# Patient Record
Sex: Male | Born: 1990 | Race: Black or African American | Hispanic: No | Marital: Single | Smoking: Never smoker
Health system: Southern US, Community
[De-identification: ages and names within clinical notes are randomized; demographics above are authoritative.]

## PROBLEM LIST (undated history)

## (undated) DIAGNOSIS — R569 Unspecified convulsions: Secondary | ICD-10-CM

## (undated) DIAGNOSIS — E739 Lactose intolerance, unspecified: Secondary | ICD-10-CM

## (undated) DIAGNOSIS — D649 Anemia, unspecified: Secondary | ICD-10-CM

## (undated) DIAGNOSIS — K5900 Constipation, unspecified: Secondary | ICD-10-CM

## (undated) DIAGNOSIS — S0990XA Unspecified injury of head, initial encounter: Secondary | ICD-10-CM

## (undated) HISTORY — PX: WISDOM TOOTH EXTRACTION: SHX21

## (undated) HISTORY — PX: OTHER SURGICAL HISTORY: SHX169

## (undated) HISTORY — PX: MANDIBLE FRACTURE SURGERY: SHX706

## (undated) HISTORY — PX: VAGUS NERVE STIMULATOR INSERTION: SHX348

## (undated) SURGERY — Surgical Case
Anesthesia: *Unknown

---

## 2013-06-12 ENCOUNTER — Emergency Department (HOSPITAL_BASED_OUTPATIENT_CLINIC_OR_DEPARTMENT_OTHER)
Admission: EM | Admit: 2013-06-12 | Discharge: 2013-06-12 | Disposition: A | Discharge status: Home / Self Care | Payer: Medicare Other | Attending: Emergency Medicine | Admitting: Emergency Medicine

## 2013-06-12 ENCOUNTER — Encounter (HOSPITAL_BASED_OUTPATIENT_CLINIC_OR_DEPARTMENT_OTHER): Payer: Self-pay | Admitting: Emergency Medicine

## 2013-06-12 DIAGNOSIS — J020 Streptococcal pharyngitis: Secondary | ICD-10-CM | POA: Insufficient documentation

## 2013-06-12 DIAGNOSIS — Z880 Allergy status to penicillin: Secondary | ICD-10-CM | POA: Insufficient documentation

## 2013-06-12 DIAGNOSIS — Z79899 Other long term (current) drug therapy: Secondary | ICD-10-CM | POA: Insufficient documentation

## 2013-06-12 DIAGNOSIS — G40909 Epilepsy, unspecified, not intractable, without status epilepticus: Secondary | ICD-10-CM | POA: Insufficient documentation

## 2013-06-12 HISTORY — DX: Unspecified convulsions: R56.9

## 2013-06-12 MED ORDER — AZITHROMYCIN 250 MG PO TABS: 250.0000 mg | ORAL_TABLET | Freq: Every day | ORAL | Status: DC

## 2013-06-12 MED ORDER — AZITHROMYCIN 250 MG PO TABS
500.0000 mg | ORAL_TABLET | Freq: Once | ORAL | Status: AC
  Administered 2013-06-12: 500 mg via ORAL
  Filled 2013-06-12: qty 2

## 2013-06-12 NOTE — Discharge Instructions (Signed)
Strep Throat  Strep throat is an infection of the throat caused by a bacteria named Streptococcus pyogenes. Your caregiver may call the infection streptococcal "tonsillitis" or "pharyngitis" depending on whether there are signs of inflammation in the tonsils or back of the throat. Strep throat is most common in children aged 24 15 years during the cold months of the year, but it can occur in people of any age during any season. This infection is spread from person to person (contagious) through coughing, sneezing, or other close contact.  SYMPTOMS   · Fever or chills.  · Painful, swollen, red tonsils or throat.  · Pain or difficulty when swallowing.  · White or yellow spots on the tonsils or throat.  · Swollen, tender lymph nodes or "glands" of the neck or under the jaw.  · Red rash all over the body (rare).  DIAGNOSIS   Many different infections can cause the same symptoms. A test must be done to confirm the diagnosis so the right treatment can be given. A "rapid strep test" can help your caregiver make the diagnosis in a few minutes. If this test is not available, a light swab of the infected area can be used for a throat culture test. If a throat culture test is done, results are usually available in a day or two.  TREATMENT   Strep throat is treated with antibiotic medicine.  HOME CARE INSTRUCTIONS   · Gargle with 1 tsp of salt in 1 cup of warm water, 3 4 times per day or as needed for comfort.  · Family members who also have a sore throat or fever should be tested for strep throat and treated with antibiotics if they have the strep infection.  · Make sure everyone in your household washes their hands well.  · Do not share food, drinking cups, or personal items that could cause the infection to spread to others.  · You may need to eat a soft food diet until your sore throat gets better.  · Drink enough water and fluids to keep your urine clear or pale yellow. This will help prevent dehydration.  · Get plenty of  rest.  · Stay home from school, daycare, or work until you have been on antibiotics for 24 hours.  · Only take over-the-counter or prescription medicines for pain, discomfort, or fever as directed by your caregiver.  · If antibiotics are prescribed, take them as directed. Finish them even if you start to feel better.  SEEK MEDICAL CARE IF:   · The glands in your neck continue to enlarge.  · You develop a rash, cough, or earache.  · You cough up green, yellow-brown, or bloody sputum.  · You have pain or discomfort not controlled by medicines.  · Your problems seem to be getting worse rather than better.  SEEK IMMEDIATE MEDICAL CARE IF:   · You develop any new symptoms such as vomiting, severe headache, stiff or painful neck, chest pain, shortness of breath, or trouble swallowing.  · You develop severe throat pain, drooling, or changes in your voice.  · You develop swelling of the neck, or the skin on the neck becomes red and tender.  · You have a fever.  · You develop signs of dehydration, such as fatigue, dry mouth, and decreased urination.  · You become increasingly sleepy, or you cannot wake up completely.  Document Released: 01/31/2000 Document Revised: 01/20/2012 Document Reviewed: 04/03/2010  ExitCare® Patient Information ©2014 [employer].

## 2013-06-12 NOTE — ED Provider Notes (Signed)
CSN: [id number]     Arrival date & time 06/12/13  [id number] History   First MD Initiated Contact with Patient 06/12/13 1029     Chief Complaint  Patient presents with  . Sore Throat     (Consider location/radiation/quality/duration/timing/severity/associated sxs/prior Treatment) Patient is a 23 y.o. male presenting with pharyngitis.  Sore Throat   Pt seen at [facility] 3 days ago for strep throat. States he was started on erythromycin, but then he called his Neurologist who advised him to stop as it may interact with his dilantin. He was advised to start Augmentin, but he has an PCN allergy. Presents today for 'a new antibiotic'. Continues to have moderate aching sore throat worse with swallowing, no fever. No cough.   Past Medical History  Diagnosis Date  . Seizures    Past Surgical History  Procedure Laterality Date  . Vagus nerve stimulator insertion Left     for seizure control    No family history on file. History  Substance Use Topics  . Smoking status: Never Smoker   . Smokeless tobacco: Not on file  . Alcohol Use: No    Review of Systems All other systems reviewed and are negative except as noted in HPI.     Allergies  Penicillins and Potiga  Home Medications   Prior to Admission medications   Medication Sig Start Date End Date Taking? Authorizing Provider  felbamate (FELBATOL) 600 MG tablet Take 600 mg by mouth 3 (three) times daily.   Yes Historical Provider, MD  lacosamide (VIMPAT) 200 MG TABS tablet Take 200 mg by mouth 2 (two) times daily.   Yes Historical Provider, MD  LORazepam (ATIVAN) 0.5 MG tablet Take 0.5 mg by mouth every 8 (eight) hours.   Yes Historical Provider, MD  phenytoin (DILANTIN) 30 MG ER capsule Take by mouth 3 (three) times daily.   Yes Historical Provider, MD  phenytoin (DILANTIN) 300 MG ER capsule Take 300 mg by mouth 3 (three) times daily.   Yes Historical Provider, MD  pregabalin (LYRICA) 300 MG capsule Take 300 mg by mouth 2  (two) times daily.   Yes Historical Provider, MD  primidone (MYSOLINE) 250 MG tablet Take 250 mg by mouth 4 (four) times daily.   Yes Historical Provider, MD   BP 149/76  Temp(Src) 97.8 F (36.6 C) (Oral)  Resp 18  Ht 5\' 10"  (1.778 m)  Wt 253 lb (114.76 kg)  BMI 36.30 kg/m2  SpO2 100% Physical Exam  Nursing note and vitals reviewed. Constitutional: He is oriented to person, place, and time. He appears well-developed and well-nourished.  HENT:  Head: Normocephalic and atraumatic.  Mouth/Throat: No oropharyngeal exudate.  Mild posterior oropharyngeal erythema  Eyes: EOM are normal. Pupils are equal, round, and reactive to light.  Neck: Normal range of motion. Neck supple.  Cardiovascular: Normal rate, normal heart sounds and intact distal pulses.   Pulmonary/Chest: Effort normal and breath sounds normal.  Abdominal: Bowel sounds are normal. He exhibits no distension. There is no tenderness.  Musculoskeletal: Normal range of motion. He exhibits no edema and no tenderness.  Lymphadenopathy:    He has cervical adenopathy.  Neurological: He is alert and oriented to person, place, and time. He has normal strength. No cranial nerve deficit or sensory deficit.  Skin: Skin is warm and dry. No rash noted.  Psychiatric: He has a normal mood and affect.    ED Course  Procedures (including critical care time) Labs Review Labs Reviewed - No data  to display  Imaging Review No results found.   EKG Interpretation None      MDM   Final diagnoses:  Strep throat    Zithromax for presumed strep, should not interfere with dilantin. Advised PCP or Neuro followup PRN.    Koree B. Bernette MayersSheldon, MD 06/12/13 1048

## 2013-06-12 NOTE — ED Notes (Addendum)
Sore throat for the last three days.  Denies fever, headache or body aches. Patient [facility].  States he took the medication Friday, Sat and one pill on Sun.  Mother of child called the Neurologist today and was told that the Erythromycin he was taking would interfer with his seizure medications.  Here today for a different medication.  Was told by Neurologist he could take Augmentin or amoxil.  Pt has a Penicillin allergy with hives.

## 2013-09-07 ENCOUNTER — Emergency Department (HOSPITAL_BASED_OUTPATIENT_CLINIC_OR_DEPARTMENT_OTHER)
Admission: EM | Admit: 2013-09-07 | Discharge: 2013-09-07 | Disposition: A | Discharge status: Home / Self Care | Payer: Medicare Other | Attending: Emergency Medicine | Admitting: Emergency Medicine

## 2013-09-07 ENCOUNTER — Encounter (HOSPITAL_BASED_OUTPATIENT_CLINIC_OR_DEPARTMENT_OTHER): Payer: Self-pay | Admitting: Emergency Medicine

## 2013-09-07 DIAGNOSIS — Z792 Long term (current) use of antibiotics: Secondary | ICD-10-CM | POA: Insufficient documentation

## 2013-09-07 DIAGNOSIS — Y929 Unspecified place or not applicable: Secondary | ICD-10-CM | POA: Insufficient documentation

## 2013-09-07 DIAGNOSIS — Z23 Encounter for immunization: Secondary | ICD-10-CM | POA: Insufficient documentation

## 2013-09-07 DIAGNOSIS — Z79899 Other long term (current) drug therapy: Secondary | ICD-10-CM | POA: Diagnosis not present

## 2013-09-07 DIAGNOSIS — S51809A Unspecified open wound of unspecified forearm, initial encounter: Secondary | ICD-10-CM | POA: Diagnosis present

## 2013-09-07 DIAGNOSIS — Y9389 Activity, other specified: Secondary | ICD-10-CM | POA: Insufficient documentation

## 2013-09-07 DIAGNOSIS — Z880 Allergy status to penicillin: Secondary | ICD-10-CM | POA: Insufficient documentation

## 2013-09-07 DIAGNOSIS — S51811A Laceration without foreign body of right forearm, initial encounter: Secondary | ICD-10-CM

## 2013-09-07 DIAGNOSIS — W268XXA Contact with other sharp object(s), not elsewhere classified, initial encounter: Secondary | ICD-10-CM | POA: Insufficient documentation

## 2013-09-07 DIAGNOSIS — S60419A Abrasion of unspecified finger, initial encounter: Secondary | ICD-10-CM

## 2013-09-07 DIAGNOSIS — G40909 Epilepsy, unspecified, not intractable, without status epilepticus: Secondary | ICD-10-CM | POA: Diagnosis not present

## 2013-09-07 DIAGNOSIS — W010XXA Fall on same level from slipping, tripping and stumbling without subsequent striking against object, initial encounter: Secondary | ICD-10-CM | POA: Diagnosis not present

## 2013-09-07 DIAGNOSIS — S60511A Abrasion of right hand, initial encounter: Secondary | ICD-10-CM

## 2013-09-07 MED ORDER — TETANUS-DIPHTH-ACELL PERTUSSIS 5-2.5-18.5 LF-MCG/0.5 IM SUSP
0.5000 mL | Freq: Once | INTRAMUSCULAR | Status: AC
  Administered 2013-09-07: 0.5 mL via INTRAMUSCULAR
  Filled 2013-09-07: qty 0.5

## 2013-09-07 NOTE — ED Provider Notes (Signed)
CSN: [id number]     Arrival date & time 09/07/13  1646 History   First MD Initiated Contact with Patient 09/07/13 1734     Chief Complaint  Patient presents with  . Arm Injury     (Consider location/radiation/quality/duration/timing/severity/associated sxs/prior Treatment) Patient is a 23 y.o. male presenting with arm injury. The history is provided by the patient. No language interpreter was used.  Arm Injury Location:  Arm Arm location:  R forearm Associated symptoms: no fever   Associated symptoms comment:  Laceration to multiple areas of right arm after tripping and falling through a glass door. No other injury.    Past Medical History  Diagnosis Date  . Seizures    Past Surgical History  Procedure Laterality Date  . Vagus nerve stimulator insertion Left     for seizure control    No family history on file. History  Substance Use Topics  . Smoking status: Never Smoker   . Smokeless tobacco: Not on file  . Alcohol Use: No    Review of Systems  Constitutional: Negative for fever and chills.  Musculoskeletal: Negative.   Skin:       Laceration.  Neurological: Negative.  Negative for numbness.      Allergies  Penicillins and Potiga  Home Medications   Prior to Admission medications   Medication Sig Start Date End Date Taking? Authorizing Provider  azithromycin (ZITHROMAX) 250 MG tablet Take 1 tablet (250 mg total) by mouth daily. 06/12/13   Landers B. Bernette MayersSheldon, MD  felbamate (FELBATOL) 600 MG tablet Take 600 mg by mouth 3 (three) times daily.    Historical Provider, MD  lacosamide (VIMPAT) 200 MG TABS tablet Take 200 mg by mouth 2 (two) times daily.    Historical Provider, MD  LORazepam (ATIVAN) 0.5 MG tablet Take 0.5 mg by mouth every 8 (eight) hours.    Historical Provider, MD  phenytoin (DILANTIN) 30 MG ER capsule Take by mouth 3 (three) times daily.    Historical Provider, MD  phenytoin (DILANTIN) 300 MG ER capsule Take 300 mg by mouth 3 (three) times daily.     Historical Provider, MD  pregabalin (LYRICA) 300 MG capsule Take 300 mg by mouth 2 (two) times daily.    Historical Provider, MD  primidone (MYSOLINE) 250 MG tablet Take 250 mg by mouth 4 (four) times daily.    Historical Provider, MD   There were no vitals taken for this visit. Physical Exam  Constitutional: He appears well-developed and well-nourished. No distress.  Musculoskeletal:  FROM all joints.   Skin:  2 cm laceration right forearm. Superficial lacerations not requiring repair on index and middle right fingers, and ulnar palm of right hand.     ED Course  Procedures (including critical care time) Labs Review Labs Reviewed - No data to display  Imaging Review No results found.   EKG Interpretation None      MDM   Final diagnoses:  None    1. Laceration right forearm 2. Multiple abrasions  LACERATION REPAIR Performed by: Elpidio AnisUPSTILL, Dionisio Aragones A Authorized by: Elpidio AnisUPSTILL, Harley Mccartney A Consent: Verbal consent obtained. Risks and benefits: risks, benefits and alternatives were discussed Consent given by: patient Patient identity confirmed: provided demographic data Prepped and Draped in normal sterile fashion Wound explored  Laceration Location: right FA  Laceration Length: 2 cm  No Foreign Bodies seen or palpated  Anesthesia: local infiltration  Local anesthetic: lidocaine 2%  w/epinephrine  Anesthetic total: 1 ml  Irrigation method: syringe Amount of  cleaning: standard  Skin closure: staples  Number of sutures: 2  Technique: staple  Patient tolerance: Patient tolerated the procedure well with no immediate complications.     Arnoldo Hooker, PA-C 09/07/13 1817

## 2013-09-07 NOTE — ED Notes (Signed)
Fell thru glass door today-c/o cuts to arm pt with bandaids in place to right 1st, 4th, 5th fingers and to forearm-no bleed thru noted

## 2013-09-07 NOTE — Discharge Instructions (Signed)
Staple Care and Removal °Your caregiver has used staples today to repair your wound. Staples are used to help a wound heal faster by holding the edges of the wound together. The staples can be removed when the wound has healed well enough to stay together after the staples are removed. A dressing (wound covering), depending on the location of the wound, may have been applied. This may be changed once per day or as instructed. If the dressing sticks, it may be soaked off with soapy water or hydrogen peroxide. °Only take over-the-counter or prescription medicines for pain, discomfort, or fever as directed by your caregiver.  °If you did not receive a tetanus shot today because you did not recall when your last one was given, check with your caregiver when you have your staples removed to determine if one is needed. °Return to your caregiver's office in 1 week or as suggested to have your staples removed. °SEEK IMMEDIATE MEDICAL CARE IF:  °· You have redness, swelling, or increasing pain in the wound. °· You have pus coming from the wound. °· You have a fever. °· You notice a bad smell coming from the wound or dressing. °· Your wound edges break open after staples have been removed. °Document Released: 10/28/2000 Document Revised: 04/27/2011 Document Reviewed: 11/12/2004 °ExitCare® Patient Information ©2015 [employer]. This information is not intended to replace advice given to you by your health care provider. Make sure you discuss any questions you have with your health care provider. ° °

## 2013-09-08 NOTE — ED Provider Notes (Signed)
Medical screening examination/treatment/procedure(s) were performed by non-physician practitioner and as supervising physician I was immediately available for consultation/collaboration.   EKG Interpretation None        Jomarion Mish S Clenton Esper, MD 09/08/13 1704 

## 2013-09-18 ENCOUNTER — Emergency Department (HOSPITAL_BASED_OUTPATIENT_CLINIC_OR_DEPARTMENT_OTHER)
Admission: EM | Admit: 2013-09-18 | Discharge: 2013-09-18 | Disposition: A | Discharge status: Home / Self Care | Payer: Medicare Other | Attending: Emergency Medicine | Admitting: Emergency Medicine

## 2013-09-18 ENCOUNTER — Encounter (HOSPITAL_BASED_OUTPATIENT_CLINIC_OR_DEPARTMENT_OTHER): Payer: Self-pay | Admitting: Emergency Medicine

## 2013-09-18 DIAGNOSIS — Z880 Allergy status to penicillin: Secondary | ICD-10-CM | POA: Insufficient documentation

## 2013-09-18 DIAGNOSIS — Z792 Long term (current) use of antibiotics: Secondary | ICD-10-CM | POA: Insufficient documentation

## 2013-09-18 DIAGNOSIS — Z79899 Other long term (current) drug therapy: Secondary | ICD-10-CM | POA: Diagnosis not present

## 2013-09-18 DIAGNOSIS — Z4802 Encounter for removal of sutures: Secondary | ICD-10-CM | POA: Diagnosis not present

## 2013-09-18 DIAGNOSIS — G40909 Epilepsy, unspecified, not intractable, without status epilepticus: Secondary | ICD-10-CM | POA: Insufficient documentation

## 2013-09-18 NOTE — ED Provider Notes (Signed)
Medical screening examination/treatment/procedure(s) were performed by non-physician practitioner and as supervising physician I was immediately available for consultation/collaboration.     Reed Eifert, MD 09/18/13 1309 

## 2013-09-18 NOTE — ED Notes (Signed)
Here for staple removal right forearm.

## 2013-09-18 NOTE — Discharge Instructions (Signed)

## 2013-09-18 NOTE — ED Provider Notes (Signed)
CSN: [id number]     Arrival date & time 09/18/13  [id number] History   First MD Initiated Contact with Patient 09/18/13 [phone]     Chief Complaint  Patient presents with  . Suture / Staple Removal     (Consider location/radiation/quality/duration/timing/severity/associated sxs/prior Treatment) Patient is a 23 y.o. male presenting with suture removal.  Suture / Staple Removal Pertinent negatives include no fever. Associated symptoms comments: Here for staple removal from laceration repair to right forearm. No complaints. .    Past Medical History  Diagnosis Date  . Seizures    Past Surgical History  Procedure Laterality Date  . Vagus nerve stimulator insertion Left     for seizure control    No family history on file. History  Substance Use Topics  . Smoking status: Never Smoker   . Smokeless tobacco: Not on file  . Alcohol Use: No    Review of Systems  Constitutional: Negative for fever.  Skin:       See HPI.      Allergies  Penicillins and Potiga  Home Medications   Prior to Admission medications   Medication Sig Start Date End Date Taking? Authorizing Provider  azithromycin (ZITHROMAX) 250 MG tablet Take 1 tablet (250 mg total) by mouth daily. 06/12/13   Glover B. Bernette MayersSheldon, MD  felbamate (FELBATOL) 600 MG tablet Take 600 mg by mouth 3 (three) times daily.    Historical Provider, MD  lacosamide (VIMPAT) 200 MG TABS tablet Take 200 mg by mouth 2 (two) times daily.    Historical Provider, MD  LORazepam (ATIVAN) 0.5 MG tablet Take 0.5 mg by mouth every 8 (eight) hours.    Historical Provider, MD  phenytoin (DILANTIN) 30 MG ER capsule Take by mouth 3 (three) times daily.    Historical Provider, MD  phenytoin (DILANTIN) 300 MG ER capsule Take 300 mg by mouth 3 (three) times daily.    Historical Provider, MD  pregabalin (LYRICA) 300 MG capsule Take 300 mg by mouth 2 (two) times daily.    Historical Provider, MD  primidone (MYSOLINE) 250 MG tablet Take 250 mg by mouth 4 (four)  times daily.    Historical Provider, MD   BP 150/55  Pulse 68  Temp(Src) 98.5 F (36.9 C) (Oral)  Resp 14  Ht 5\' 9"  (1.753 m)  Wt 242 lb (109.77 kg)  BMI 35.72 kg/m2  SpO2 100% Physical Exam  Constitutional: He is oriented to person, place, and time. He appears well-developed and well-nourished. No distress.  Neurological: He is alert and oriented to person, place, and time.  Skin: Skin is warm and dry. No erythema.  Two staples to well healed linear laceration right forearm. Non-tender.     ED Course  Procedures (including critical care time) Labs Review Labs Reviewed - No data to display  Imaging Review No results found.   EKG Interpretation None      MDM   Final diagnoses:  Encounter for staple removal   Staples removed by nursing.     Arnoldo HookerShari A Antinette Keough, PA-C 09/18/13 [phone]

## 2013-09-25 ENCOUNTER — Encounter (HOSPITAL_BASED_OUTPATIENT_CLINIC_OR_DEPARTMENT_OTHER): Payer: Self-pay | Admitting: Emergency Medicine

## 2013-09-25 ENCOUNTER — Emergency Department (HOSPITAL_BASED_OUTPATIENT_CLINIC_OR_DEPARTMENT_OTHER)
Admission: EM | Admit: 2013-09-25 | Discharge: 2013-09-25 | Disposition: A | Discharge status: Home / Self Care | Payer: Medicare Other | Attending: Emergency Medicine | Admitting: Emergency Medicine

## 2013-09-25 DIAGNOSIS — G40909 Epilepsy, unspecified, not intractable, without status epilepticus: Secondary | ICD-10-CM | POA: Diagnosis not present

## 2013-09-25 DIAGNOSIS — R51 Headache: Secondary | ICD-10-CM | POA: Diagnosis present

## 2013-09-25 DIAGNOSIS — M531 Cervicobrachial syndrome: Secondary | ICD-10-CM | POA: Insufficient documentation

## 2013-09-25 DIAGNOSIS — M5481 Occipital neuralgia: Secondary | ICD-10-CM

## 2013-09-25 DIAGNOSIS — Z880 Allergy status to penicillin: Secondary | ICD-10-CM | POA: Diagnosis not present

## 2013-09-25 DIAGNOSIS — Z79899 Other long term (current) drug therapy: Secondary | ICD-10-CM | POA: Diagnosis not present

## 2013-09-25 MED ORDER — HYDROCODONE-ACETAMINOPHEN 5-325 MG PO TABS: 2.0000 | ORAL_TABLET | ORAL | Status: DC | PRN

## 2013-09-25 MED ORDER — METHYLPREDNISOLONE SODIUM SUCC 125 MG IJ SOLR
125.0000 mg | Freq: Once | INTRAMUSCULAR | Status: AC
  Administered 2013-09-25: 125 mg via INTRAMUSCULAR
  Filled 2013-09-25: qty 2

## 2013-09-25 MED ORDER — KETOROLAC TROMETHAMINE 60 MG/2ML IM SOLN
60.0000 mg | Freq: Once | INTRAMUSCULAR | Status: AC
  Administered 2013-09-25: 60 mg via INTRAMUSCULAR
  Filled 2013-09-25: qty 2

## 2013-09-25 NOTE — ED Provider Notes (Signed)
Medical screening examination/treatment/procedure(s) were performed by non-physician practitioner and as supervising physician I was immediately available for consultation/collaboration.   EKG Interpretation None        Trevor ChurnJohn David Kashlyn Salinas III, MD 09/25/13 1744

## 2013-09-25 NOTE — Discharge Instructions (Signed)
Occipital Neuralgia Occipital neuralgia is a type of headache that causes episodes of very bad pain in the back of your head. Pain from occipital neuralgia may spread (radiate) to other parts of your head. The pain is usually brief and often goes away after you rest and relax. These headaches may be caused by irritation of the nerves that leave your spinal cord high up in your neck, just below the base of your skull (occipital nerves). Your occipital nerves transmit sensations from the back of your head, the top of your head, and the areas behind your ears. CAUSES Occipital neuralgia can occur without any known cause (primary headache syndrome). In other cases, occipital neuralgia is caused by pressure on or irritation of one of the two occipital nerves. Causes of occipital nerve compression or irritation include:  Wear and tear of the vertebrae in the neck (osteoarthritis).  Neck injury.  Disease of the disks that separate the vertebrae.  Tumors.  Gout.  Infections.  Diabetes.  Swollen blood vessels that put pressure on the occipital nerves.  Muscle spasm in the neck. SIGNS AND SYMPTOMS Pain is the main symptom of occipital neuralgia. It usually starts in the back of the head but may also be felt in other areas supplied by the occipital nerves. Pain is usually on one side but may be on both sides. You may have:   Brief episodes of very bad pain that is burning, stabbing, shocking, or shooting.  Pain behind the eye.  Pain triggered by neck movement or hair brushing.  Scalp tenderness.  Aching in the back of the head between episodes of very bad pain. DIAGNOSIS  Your health care provider may diagnose occipital neuralgia based on your symptoms and a physical exam. During the exam, the health care provider may push on areas supplied by the occipital nerves to see if they are painful. Some tests may also be done to help in making the diagnosis. These may include:  Imaging studies of  the upper spinal cord, such as an MRI or CT scan. These may show compression or spinal cord abnormalities.  Nerve block. You will get an injection of numbing medicine (local anesthetic) near the occipital nerve to see if this relieves pain. TREATMENT  Treatment may begin with simple measures, such as:   Rest.  Massage.  Heat.  Over-the-counter pain relievers. If these measures do not work, you may need other treatments, including:  Medicines such as:  Prescription-strength anti-inflammatory medicines.  Muscle relaxants.  Antiseizure medicines.  Antidepressants.  Steroid injection. This involves injections of local anesthetic and strong anti-inflammatory drugs (steroids).  Pulsed radiofrequency. Wires are implanted to deliver electrical impulses that block pain signals from the occipital nerve.  Physical therapy.  Surgery to relieve nerve pressure. HOME CARE INSTRUCTIONS  Take all medicines as directed by your health care provider.  Avoid activities that cause pain.  Rest when you have an attack of pain.  Try gentle massage or a heating pad to relieve pain.  Work with a physical therapist to learn stretching exercises you can do at home.  Try a different pillow or sleeping position.  Practice good posture.  Try to stay active. Get regular exercise that does not cause pain. Ask your health care provider to suggest safe exercises for you.  Keep all follow-up visits as directed by your health care provider. This is important. SEEK MEDICAL CARE IF:  Your medicine is not working.  You have new or worsening symptoms. [place]  IF:  You have very bad head pain that is not going away.  You have a sudden change in vision, balance, or speech. MAKE SURE YOU:  Understand these instructions.  Will watch your condition.  Will get help right away if you are not doing well or get worse. Document Released: 01/27/2001 Document Revised: 06/19/2013  Document Reviewed: 01/25/2013 [facility] Patient Information 2015 [place], [place]. This information is not intended to replace advice given to you by your health care provider. Make sure you discuss any questions you have with your health care provider.  Occipital Neuralgia Occipital neuralgia is a type of headache that causes episodes of very bad pain in the back of your head. Pain from occipital neuralgia may spread (radiate) to other parts of your head. The pain is usually brief and often goes away after you rest and relax. These headaches may be caused by irritation of the nerves that leave your spinal cord high up in your neck, just below the base of your skull (occipital nerves). Your occipital nerves transmit sensations from the back of your head, the top of your head, and the areas behind your ears. CAUSES Occipital neuralgia can occur without any known cause (primary headache syndrome). In other cases, occipital neuralgia is caused by pressure on or irritation of one of the two occipital nerves. Causes of occipital nerve compression or irritation include:  Wear and tear of the vertebrae in the neck (osteoarthritis).  Neck injury.  Disease of the disks that separate the vertebrae.  Tumors.  Gout.  Infections.  Diabetes.  Swollen blood vessels that put pressure on the occipital nerves.  Muscle spasm in the neck. SIGNS AND SYMPTOMS Pain is the main symptom of occipital neuralgia. It usually starts in the back of the head but may also be felt in other areas supplied by the occipital nerves. Pain is usually on one side but may be on both sides. You may have:   Brief episodes of very bad pain that is burning, stabbing, shocking, or shooting.  Pain behind the eye.  Pain triggered by neck movement or hair brushing.  Scalp tenderness.  Aching in the back of the head between episodes of very bad pain. DIAGNOSIS  Your health care provider may diagnose occipital neuralgia based on  your symptoms and a physical exam. During the exam, the health care provider may push on areas supplied by the occipital nerves to see if they are painful. Some tests may also be done to help in making the diagnosis. These may include:  Imaging studies of the upper spinal cord, such as an MRI or CT scan. These may show compression or spinal cord abnormalities.  Nerve block. You will get an injection of numbing medicine (local anesthetic) near the occipital nerve to see if this relieves pain. TREATMENT  Treatment may begin with simple measures, such as:   Rest.  Massage.  Heat.  Over-the-counter pain relievers. If these measures do not work, you may need other treatments, including:  Medicines such as:  Prescription-strength anti-inflammatory medicines.  Muscle relaxants.  Antiseizure medicines.  Antidepressants.  Steroid injection. This involves injections of local anesthetic and strong anti-inflammatory drugs (steroids).  Pulsed radiofrequency. Wires are implanted to deliver electrical impulses that block pain signals from the occipital nerve.  Physical therapy.  Surgery to relieve nerve pressure. HOME CARE INSTRUCTIONS  Take all medicines as directed by your health care provider.  Avoid activities that cause pain.  Rest when you have an attack of pain.  Try gentle massage or a heating pad to relieve pain.  Work with a physical therapist to learn stretching exercises you can do at home.  Try a different pillow or sleeping position.  Practice good posture.  Try to stay active. Get regular exercise that does not cause pain. Ask your health care provider to suggest safe exercises for you.  Keep all follow-up visits as directed by your health care provider. This is important. SEEK MEDICAL CARE IF:  Your medicine is not working.  You have new or worsening symptoms. SEEK IMMEDIATE MEDICAL CARE IF:  You have very bad head pain that is not going away.  You have  a sudden change in vision, balance, or speech. MAKE SURE YOU:  Understand these instructions.  Will watch your condition.  Will get help right away if you are not doing well or get worse. Document Released: 01/27/2001 Document Revised: 06/19/2013 Document Reviewed: 01/25/2013 [facility] Patient Information 2015 [place], [place]. This information is not intended to replace advice given to you by your health care provider. Make sure you discuss any questions you have with your health care provider.

## 2013-09-25 NOTE — ED Provider Notes (Signed)
CSN: [id number]     Arrival date & time 09/25/13  1518 History   First MD Initiated Contact with Patient 09/25/13 1649     Chief Complaint  Patient presents with  . Headache     (Consider location/radiation/quality/duration/timing/severity/associated sxs/prior Treatment) Patient is a 23 y.o. male presenting with headaches. The history is provided by the patient. No language interpreter was used.  Headache Pain location:  Generalized Quality:  Stabbing Radiates to:  Does not radiate Severity currently:  10/10 Severity at highest:  10/10 Onset quality:  Gradual Duration:  2 days Timing:  Constant Progression:  Worsening Chronicity:  New Similar to prior headaches: yes   Context: activity   Relieved by:  Nothing Worsened by:  Nothing tried Associated symptoms: no blurred vision and no dizziness   Risk factors: no anger   Pt has a history of occipital neuralgia and receives injections from his neurologist in [place].   Pt recently moved here.  Past Medical History  Diagnosis Date  . Seizures    Past Surgical History  Procedure Laterality Date  . Vagus nerve stimulator insertion Left     for seizure control    History reviewed. No pertinent family history. History  Substance Use Topics  . Smoking status: Never Smoker   . Smokeless tobacco: Not on file  . Alcohol Use: No    Review of Systems  Eyes: Negative for blurred vision.  Neurological: Positive for headaches. Negative for dizziness.  All other systems reviewed and are negative.     Allergies  Penicillins and Potiga  Home Medications   Prior to Admission medications   Medication Sig Start Date End Date Taking? Authorizing Provider  azithromycin (ZITHROMAX) 250 MG tablet Take 1 tablet (250 mg total) by mouth daily. 06/12/13   Jakavion B. Bernette Mayers, MD  felbamate (FELBATOL) 600 MG tablet Take 600 mg by mouth 3 (three) times daily.    Historical Provider, MD  lacosamide (VIMPAT) 200 MG TABS tablet Take 200 mg  by mouth 2 (two) times daily.    Historical Provider, MD  LORazepam (ATIVAN) 0.5 MG tablet Take 0.5 mg by mouth every 8 (eight) hours.    Historical Provider, MD  phenytoin (DILANTIN) 30 MG ER capsule Take by mouth 3 (three) times daily.    Historical Provider, MD  phenytoin (DILANTIN) 300 MG ER capsule Take 300 mg by mouth 3 (three) times daily.    Historical Provider, MD  pregabalin (LYRICA) 300 MG capsule Take 300 mg by mouth 2 (two) times daily.    Historical Provider, MD  primidone (MYSOLINE) 250 MG tablet Take 250 mg by mouth 4 (four) times daily.    Historical Provider, MD   BP 137/81  Pulse 60  Temp(Src) 97.8 F (36.6 C) (Oral)  Resp 16  Wt 240 lb (108.863 kg)  SpO2 100% Physical Exam  Nursing note and vitals reviewed. Constitutional: He is oriented to person, place, and time. He appears well-developed and well-nourished.  HENT:  Head: Normocephalic.  Eyes: EOM are normal. Pupils are equal, round, and reactive to light.  Neck: Normal range of motion.  Cardiovascular: Normal rate and regular rhythm.   Pulmonary/Chest: Effort normal.  Abdominal: Soft. He exhibits no distension.  Musculoskeletal: Normal range of motion.  Neurological: He is alert and oriented to person, place, and time. No cranial nerve deficit. Coordination normal.  Skin: Skin is warm.  Psychiatric: He has a normal mood and affect.    ED Course  Procedures (including critical care time) Labs  Review Labs Reviewed - No data to display  Imaging Review No results found.   EKG Interpretation None      MDM   Final diagnoses:  Bilateral occipital neuralgia    Pt given injection of solumedrol and torodol [facility]    Elson AreasLeslie K Sofia, PA-C 09/25/13 1712

## 2013-09-25 NOTE — ED Notes (Signed)
Pt c/o h/a x 2 days 

## 2014-05-13 ENCOUNTER — Emergency Department (HOSPITAL_BASED_OUTPATIENT_CLINIC_OR_DEPARTMENT_OTHER): Payer: Medicare Other

## 2014-05-13 ENCOUNTER — Encounter (HOSPITAL_BASED_OUTPATIENT_CLINIC_OR_DEPARTMENT_OTHER): Payer: Self-pay | Admitting: *Deleted

## 2014-05-13 ENCOUNTER — Emergency Department (HOSPITAL_BASED_OUTPATIENT_CLINIC_OR_DEPARTMENT_OTHER)
Admission: EM | Admit: 2014-05-13 | Discharge: 2014-05-13 | Disposition: A | Discharge status: Home / Self Care | Payer: Medicare Other | Attending: Emergency Medicine | Admitting: Emergency Medicine

## 2014-05-13 DIAGNOSIS — Y998 Other external cause status: Secondary | ICD-10-CM | POA: Insufficient documentation

## 2014-05-13 DIAGNOSIS — S0990XA Unspecified injury of head, initial encounter: Secondary | ICD-10-CM | POA: Insufficient documentation

## 2014-05-13 DIAGNOSIS — Y92410 Unspecified street and highway as the place of occurrence of the external cause: Secondary | ICD-10-CM | POA: Diagnosis not present

## 2014-05-13 DIAGNOSIS — S299XXA Unspecified injury of thorax, initial encounter: Secondary | ICD-10-CM | POA: Diagnosis present

## 2014-05-13 DIAGNOSIS — Y9389 Activity, other specified: Secondary | ICD-10-CM | POA: Diagnosis not present

## 2014-05-13 NOTE — ED Notes (Signed)
Patient transported to X-ray via stretcher per tech. 
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
## 2014-05-13 NOTE — ED Notes (Signed)
MD at bedside. 

## 2014-05-13 NOTE — Discharge Instructions (Signed)
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
## 2016-08-20 ENCOUNTER — Encounter (HOSPITAL_BASED_OUTPATIENT_CLINIC_OR_DEPARTMENT_OTHER): Payer: Self-pay | Admitting: Emergency Medicine

## 2016-08-20 ENCOUNTER — Emergency Department (HOSPITAL_BASED_OUTPATIENT_CLINIC_OR_DEPARTMENT_OTHER)
Admission: EM | Admit: 2016-08-20 | Discharge: 2016-08-20 | Disposition: A | Discharge status: Home / Self Care | Payer: Medicare Other | Attending: Emergency Medicine | Admitting: Emergency Medicine

## 2016-08-20 DIAGNOSIS — R21 Rash and other nonspecific skin eruption: Secondary | ICD-10-CM | POA: Diagnosis not present

## 2016-08-20 DIAGNOSIS — Z79899 Other long term (current) drug therapy: Secondary | ICD-10-CM | POA: Diagnosis not present

## 2016-08-20 NOTE — ED Notes (Signed)
Pt left before receiving d/c paperwork.

## 2016-08-20 NOTE — ED Triage Notes (Signed)
Rash off and on x 2-3 weeks

## 2016-08-20 NOTE — ED Provider Notes (Signed)
[facility]   CSN: [id number] Arrival date & time: 08/20/16  1235     History   Chief Complaint Chief Complaint  Patient presents with  . Rash    HPI Trevor Baker is a 26 y.o. male.  HPI Patient is a 26 year old male who presents the emergency department complaining of off-and-on rash over the past 2-3 weeks.  Patient has seen his doctor about and was referred to dermatology.  He has not made a dermatology appointment.  He states the rash was present on his bilateral arms earlier this morning and now has since resolved.  No significant itching with the rash.  He states sometimes it burns.  He denies any new lotions or detergents.  He has no other complaints at this time.    Past Medical History:  Diagnosis Date  . Seizures (HCC)    last seizure in April 2014    There are no active problems to display for this patient.   Past Surgical History:  Procedure Laterality Date  . VAGUS NERVE STIMULATOR INSERTION Left    for seizure control        Home Medications    Prior to Admission medications   Medication Sig Start Date End Date Taking? Authorizing Provider  felbamate (FELBATOL) 600 MG tablet Take 600 mg by mouth 3 (three) times daily.    [provider]  lacosamide (VIMPAT) 200 MG TABS tablet Take 200 mg by mouth 2 (two) times daily.    [provider]  LORazepam (ATIVAN) 0.5 MG tablet Take 0.5 mg by mouth every 8 (eight) hours.    [provider]  phenytoin (DILANTIN) 30 MG ER capsule Take by mouth 3 (three) times daily.    [provider]  phenytoin (DILANTIN) 300 MG ER capsule Take 300 mg by mouth 3 (three) times daily.    [provider]  pregabalin (LYRICA) 300 MG capsule Take 150 mg by mouth 2 (two) times daily.     [provider]  primidone (MYSOLINE) 250 MG tablet Take 250 mg by mouth 4 (four) times daily.    [provider]    Family History History reviewed. No  pertinent family history.  Social History Social History  Substance Use Topics  . Smoking status: Never Smoker  . Smokeless tobacco: Never Used  . Alcohol use No     Allergies   Penicillins and Potiga [ezogabine]   Review of Systems Review of Systems  All other systems reviewed and are negative.    Physical Exam Updated Vital Signs BP 114/79   Pulse 69   Temp 98.5 F (36.9 C) (Oral)   Resp 18   Wt 104.3 kg (230 lb)   SpO2 100%   BMI 33.97 kg/m   Physical Exam  Constitutional: He is oriented to person, place, and time. He appears well-developed and well-nourished.  HENT:  Head: Normocephalic.  Eyes: EOM are normal.  Neck: Normal range of motion.  Pulmonary/Chest: Effort normal.  Abdominal: He exhibits no distension.  Musculoskeletal: Normal range of motion.  Neurological: He is alert and oriented to person, place, and time.  Skin: No rash noted.  Psychiatric: He has a normal mood and affect.  Nursing note and vitals reviewed.    ED Treatments / Results  Labs (all labs ordered are listed, but only abnormal results are displayed) Labs Reviewed - No data to display  EKG  EKG Interpretation None       Radiology No results found.  Procedures Procedures (including critical care time)  Medications Ordered in ED Medications - No data to display   Initial Impression / Assessment and Plan / ED Course  I have reviewed the triage vital signs and the nursing notes.  Pertinent labs & imaging results that were available during my care of the patient were reviewed by me and considered in my medical decision making (see chart for details).     No rash present at time of evaluation the ER.  Referral to dermatology  Final Clinical Impressions(s) / ED Diagnoses   Final diagnoses:  Rash    New Prescriptions Discharge Medication List as of 08/20/2016  1:25 PM       Azalia Bilisampos, Tyronn Golda, MD 08/20/16 1340

## 2016-08-20 NOTE — Discharge Instructions (Signed)
Please see a dermatologist for your rash

## 2016-08-20 NOTE — ED Notes (Signed)
Pt demanding food and drinks.

## 2017-05-09 ENCOUNTER — Emergency Department (HOSPITAL_BASED_OUTPATIENT_CLINIC_OR_DEPARTMENT_OTHER)
Admission: EM | Admit: 2017-05-09 | Discharge: 2017-05-09 | Disposition: A | Discharge status: Home / Self Care | Payer: Medicare Other | Attending: Emergency Medicine | Admitting: Emergency Medicine

## 2017-05-09 ENCOUNTER — Encounter (HOSPITAL_BASED_OUTPATIENT_CLINIC_OR_DEPARTMENT_OTHER): Payer: Self-pay | Admitting: Emergency Medicine

## 2017-05-09 ENCOUNTER — Other Ambulatory Visit: Payer: Self-pay

## 2017-05-09 DIAGNOSIS — R0989 Other specified symptoms and signs involving the circulatory and respiratory systems: Secondary | ICD-10-CM

## 2017-05-09 DIAGNOSIS — M542 Cervicalgia: Secondary | ICD-10-CM

## 2017-05-09 MED ORDER — FLUTICASONE PROPIONATE 50 MCG/ACT NA SUSP: 2.0000 | Freq: Every day | NASAL | 0 refills | Status: DC

## 2017-05-09 MED ORDER — IBUPROFEN 800 MG PO TABS: 800.0000 mg | ORAL_TABLET | Freq: Three times a day (TID) | ORAL | 0 refills | Status: DC | PRN

## 2017-05-09 NOTE — ED Provider Notes (Signed)
Emergency Department Provider Note   I have reviewed the triage vital signs and the nursing notes.   HISTORY  Chief Complaint Neck pain and Nose pain   HPI Trevor Baker is a 27 y.o. male with PMH of seizure disorder presents to the emergency department for evaluation of neck discomfort, runny nose, and request for Dilantin level to be drawn.  The patient states that he has had several months of neck discomfort that he thinks may have resulted from sleeping on a concrete floor during a [place].  He has had intermittent symptoms.  He denies any sudden worsening symptoms, numbness, tingling.  Fevers or chills.  No headaches.   Patient is also concerned about runny nose he has had for the last 2 days.  He has been doing cleaning with bleach products and began having nose irritation with some discharge.  No cough.   Patient is also a concern for needing to have his Dilantin level drawn.  [place] neurolog[place] near [place] and has an order to present to lab core for Dilantin level.  Patient is requesting that we draw it here in the emergency department.  His last seizure was 2014.    Past Medical History:  Diagnosis Date  . Seizures (HCC)    last seizure in April 2014    There are no active problems to display for this patient.   Past Surgical History:  Procedure Laterality Date  . VAGUS NERVE STIMULATOR INSERTION Left    for seizure control     Current Outpatient Rx  . Order #: [id number] Class: Historical Med  . Order #: [id number] Class: Print  . Order #: [id number] Class: Print  . Order #: [id number] Class: Historical Med  . Order #: [id number] Class: Historical Med  . Order #: [id number] Class: Historical Med  . Order #: [id number] Class: Historical Med  . Order #: [id number] Class: Historical Med  . Order #: [id number] Class: Historical Med    Allergies Penicillins and Potiga [ezogabine]  No family history on file.  Social History Social History   Tobacco Use    . Smoking status: Never Smoker  . Smokeless tobacco: Never Used  Substance Use Topics  . Alcohol use: No  . Drug use: No    Review of Systems  Constitutional: No fever/chills Eyes: No visual changes. ENT: No sore throat. Positive runny nose.  Cardiovascular: Denies chest pain. Respiratory: Denies shortness of breath. Gastrointestinal: No abdominal pain. No nausea, no vomiting.  No diarrhea.  No constipation. Genitourinary: Negative for dysuria. Musculoskeletal: Negative for back pain. Positive chronic neck and shoulder pain.  Skin: Negative for rash. Neurological: Negative for headaches, focal weakness or numbness.  10-point ROS otherwise negative.  ____________________________________________   PHYSICAL EXAM:  VITAL SIGNS: BP: 128/67 Pulse: 78 SpO2: 98% RA RR: 18  Constitutional: Alert and oriented. Well appearing and in no acute distress. Eyes: Conjunctivae are normal. PERRL. Head: Atraumatic. Nose: Mild congestion/rhinnorhea. Nasal mucosa is well-appearing.  Mouth/Throat: Mucous membranes are moist.  Neck: No stridor. No cervical spine tenderness to palpation. Cardiovascular: Normal rate, regular rhythm. Good peripheral circulation. Grossly normal heart sounds.   Respiratory: Normal respiratory effort.  No retractions. Lungs CTAB. Gastrointestinal: Soft and nontender. No distention. Musculoskeletal: No lower extremity tenderness nor edema. No gross deformities of extremities. Mild tenderness along the left shoulder blade. No rash, bruising, or gross deformity. Normal ROM.  Neurologic:  Normal speech and language. No gross focal neurologic deficits are appreciated.  Skin:  Skin is warm, dry and intact. No rash  noted.  ____________________________________________   PROCEDURES  Procedure(s) performed:   Procedures  None ____________________________________________   INITIAL IMPRESSION / ASSESSMENT AND PLAN / ED COURSE  Pertinent labs & imaging results  that were available during my care of the patient were reviewed by me and considered in my medical decision making (see chart for details).  Treatment for evaluation of acute on chronic neck discomfort with runny nose and request for Dilantin level to be drawn.  He is well-appearing.  He has some rhinorrhea but no acute symptoms to suggest symptoms from bleach exposure. No midline cervical or thoracic spine pain.  Nothing to suggest a strep throat or influenza.  I discussed that I was unable to draw an outpatient labs through the emergency department as I do not think there is an emergent reason to do so.  I instructed him to take his order to lab core as he has done in the past for this lab draw.   At this time, I do not feel there is any life-threatening condition present. I have reviewed and discussed all results (EKG, imaging, lab, urine as appropriate), exam findings with patient. I have reviewed nursing notes and appropriate previous records.  I feel the patient is safe to be discharged home without further emergent workup. Discussed usual and customary return precautions. Patient and family (if present) verbalize understanding and are comfortable with this plan.  Patient will follow-up with their primary care provider. If they do not have a primary care provider, information for follow-up has been provided to them. All questions have been answered.  ____________________________________________  FINAL CLINICAL IMPRESSION(S) / ED DIAGNOSES  Final diagnoses:  Neck pain  Runny nose     NEW OUTPATIENT MEDICATIONS STARTED DURING THIS VISIT:  Discharge Medication List as of 05/09/2017  4:40 PM    START taking these medications   Details  fluticasone (FLONASE) 50 MCG/ACT nasal spray Place 2 sprays into both nostrils daily for 14 days., Starting Sun 05/09/2017, Until Sun 05/23/2017, Print    ibuprofen (ADVIL,MOTRIN) 800 MG tablet Take 1 tablet (800 mg total) by mouth every 8 (eight) hours as  needed., Starting Sun 05/09/2017, Print        Note:  This document was prepared using Dragon voice recognition software and may include unintentional dictation errors.  Alona Bene, MD Emergency Medicine    Kealii Thueson, Arlyss Repress, MD 05/10/17 [phone]

## 2017-05-09 NOTE — ED Triage Notes (Addendum)
Neck pain radiating into shoulders and head x 2 days. Also reports burning pain to nostrils and states he has been cleaning with bleach. Pt reports sneezing a lot over the past few days. Pt requesting blood drawn to be taken to neurologist.

## 2017-05-09 NOTE — Discharge Instructions (Signed)
We believe that your symptoms are caused by musculoskeletal strain.  Please read through the included information about additional care such as heating pads, over-the-counter pain medicine.  If you were provided a prescription please use it only as needed and as instructed.  Remember that early mobility and using the affected part of your body is actually better than keeping it immobile.  Call your Neurologist on Monday to discuss you labs.   Follow-up with the doctor listed as recommended or return to the emergency department with new or worsening symptoms that concern you.

## 2018-01-24 ENCOUNTER — Encounter (HOSPITAL_BASED_OUTPATIENT_CLINIC_OR_DEPARTMENT_OTHER): Payer: Self-pay | Admitting: Emergency Medicine

## 2018-01-24 ENCOUNTER — Emergency Department (HOSPITAL_BASED_OUTPATIENT_CLINIC_OR_DEPARTMENT_OTHER)
Admission: EM | Admit: 2018-01-24 | Discharge: 2018-01-24 | Disposition: A | Discharge status: Home / Self Care | Payer: Medicare Other | Attending: Emergency Medicine | Admitting: Emergency Medicine

## 2018-01-24 ENCOUNTER — Other Ambulatory Visit: Payer: Self-pay

## 2018-01-24 ENCOUNTER — Emergency Department (HOSPITAL_BASED_OUTPATIENT_CLINIC_OR_DEPARTMENT_OTHER): Payer: Medicare Other

## 2018-01-24 DIAGNOSIS — R0981 Nasal congestion: Secondary | ICD-10-CM | POA: Diagnosis present

## 2018-01-24 DIAGNOSIS — J069 Acute upper respiratory infection, unspecified: Secondary | ICD-10-CM | POA: Diagnosis not present

## 2018-01-24 DIAGNOSIS — Z79899 Other long term (current) drug therapy: Secondary | ICD-10-CM | POA: Insufficient documentation

## 2018-01-24 HISTORY — DX: Lactose intolerance, unspecified: E73.9

## 2018-01-24 HISTORY — DX: Anemia, unspecified: D64.9

## 2018-01-24 LAB — GROUP A STREP BY PCR: Group A Strep by PCR: NOT DETECTED

## 2018-01-24 MED ORDER — FEXOFENADINE HCL 60 MG PO TABS: 60.0000 mg | ORAL_TABLET | Freq: Two times a day (BID) | ORAL | 0 refills | Status: DC

## 2018-01-24 MED ORDER — GUAIFENESIN ER 600 MG PO TB12: 600.0000 mg | ORAL_TABLET | Freq: Two times a day (BID) | ORAL | 0 refills | Status: DC

## 2018-01-24 MED ORDER — FLUTICASONE PROPIONATE 50 MCG/ACT NA SUSP: 2.0000 | Freq: Every day | NASAL | 0 refills | Status: DC

## 2018-01-24 MED ORDER — IBUPROFEN 400 MG PO TABS
600.0000 mg | ORAL_TABLET | Freq: Once | ORAL | Status: AC
  Administered 2018-01-24: 600 mg via ORAL
  Filled 2018-01-24: qty 1

## 2018-01-24 MED FILL — FLUTICASONE PROP 50 MCG SPR: 50 | 30 days supply | Qty: 16 | Fill #0

## 2018-01-24 NOTE — ED Notes (Signed)
Patient transported to X-ray 

## 2018-01-24 NOTE — ED Triage Notes (Signed)
Sinus congestion, runny nose, sore throat, HA since yesterday. Pt concerned because when he gets a fever it causes him to get a seizure.  No seizures as of yet.

## 2018-01-24 NOTE — Discharge Instructions (Signed)
Your symptoms are likely caused by a viral upper respiratory infection. Antibiotics are not helpful in treating viral infection, the virus should run its course in about 5-7 days. Please make sure you are drinking plenty of fluids. You can treat your symptoms supportively with tylenol/ibuprofen for fevers and pains, Allegra and Flonase to help with nasal congestion, and over the counter cough syrups and throat lozenges to help with cough. If your symptoms are not improving please follow up with you Primary doctor.   If you develop persistent fevers, shortness of breath or difficulty breathing, chest pain, severe headache and neck pain, persistent nausea and vomiting or other new or concerning symptoms return to the Emergency department.

## 2018-01-24 NOTE — ED Provider Notes (Signed)
[facility] EMERGENCY DEPARTMENT Provider Note   CSN: [id number] Arrival date & time: 01/24/18  1132     History   Chief Complaint Chief Complaint  Patient presents with  . URI    HPI Ewell PoeCharles Cowden is a 27 y.o. male.  Ewell PoeCharles Batte is a 27 y.o. Male with a history of seizures, lactose intolerance and anemia, who presents to the emergency department for evaluation of sinus congestion, runny nose, sore throat and headache.  He also reports some associated cough which is occasionally productive.  Symptoms started yesterday and may have been constant and worsening since onset.  Patient expresses concern because when he is gotten fevers in the past this is caused him to have a seizure but he has not had any fevers or seizure activity since onset of symptoms yesterday.  Patient reports he frequently gets sinus infections or strep throat and is concerned this may be what is going on.  Again patient denies any fevers or chills.  He does report pain with swallowing, and that he has had large amounts of sinus drainage down the back of his throat.  No chest pain or shortness of breath, no abdominal pain, nausea or vomiting.  No neck pain or stiffness.  He has not taken anything to treat his symptoms prior to arrival reports he typically takes allergy medication but has ran out of this and is requesting a refill prescription.  No known sick contacts.  Did not receive a flu shot this season.     Past Medical History:  Diagnosis Date  . Anemia   . Lactose intolerance   . Seizures (HCC)    last seizure in April 2014    There are no active problems to display for this patient.   Past Surgical History:  Procedure Laterality Date  . MANDIBLE FRACTURE SURGERY    . VAGUS NERVE STIMULATOR INSERTION Left    for seizure control   . WISDOM TOOTH EXTRACTION          Home Medications    Prior to Admission medications   Medication Sig Start Date End Date Taking? Authorizing Provider    cloBAZam (ONFI) 10 MG tablet Take by mouth. 11/23/17  Yes [provider]  phenytoin (PHENYTEK) 300 MG ER capsule Take by mouth. 08/30/13  Yes [provider]  calcium carbonate (CALCIUM 600) 600 MG TABS tablet Take by mouth.    [provider]  felbamate (FELBATOL) 600 MG tablet Take 600 mg by mouth 3 (three) times daily.    [provider]  fexofenadine (ALLEGRA) 60 MG tablet Take 1 tablet (60 mg total) by mouth 2 (two) times daily. 01/24/18   Dartha LodgeFord, Dryden Tapley N, PA-C  fluticasone (FLONASE) 50 MCG/ACT nasal spray Place 2 sprays into both nostrils daily. 01/24/18   Dartha LodgeFord, Singleton Hickox N, PA-C  guaiFENesin (MUCINEX) 600 MG 12 hr tablet Take 1 tablet (600 mg total) by mouth 2 (two) times daily. 01/24/18   Dartha LodgeFord, Hanzel Pizzo N, PA-C  lacosamide (VIMPAT) 200 MG TABS tablet Take 200 mg by mouth 2 (two) times daily.    [provider]  LORazepam (ATIVAN) 0.5 MG tablet Take 0.5 mg by mouth every 8 (eight) hours.    [provider]  pregabalin (LYRICA) 300 MG capsule Take 150 mg by mouth 2 (two) times daily.     [provider]  primidone (MYSOLINE) 250 MG tablet Take 250 mg by mouth 4 (four) times daily.    [provider]  Family History No family history on file.  Social History Social History   Tobacco Use  . Smoking status: Never Smoker  . Smokeless tobacco: Never Used  Substance Use Topics  . Alcohol use: No  . Drug use: No     Allergies   Penicillins and Potiga [ezogabine]   Review of Systems Review of Systems  Constitutional: Negative for chills and fever.  HENT: Positive for congestion, postnasal drip, rhinorrhea, sinus pressure, sneezing and sore throat. Negative for ear discharge and ear pain.   Respiratory: Positive for cough. Negative for chest tightness, shortness of breath and wheezing.   Cardiovascular: Negative for chest pain.  Gastrointestinal: Negative for abdominal pain, nausea and vomiting.  Musculoskeletal:  Negative for arthralgias, myalgias, neck pain and neck stiffness.  Skin: Negative for color change and rash.  Neurological: Positive for headaches. Negative for dizziness, syncope and light-headedness.     Physical Exam Updated Vital Signs BP 130/82 (BP Location: Right Arm)   Pulse 98   Temp 98.3 F (36.8 C) (Oral)   Resp 16   Ht 5\' 11"  (1.803 m)   Wt 83.9 kg   SpO2 98%   BMI 25.80 kg/m   Physical Exam  Constitutional: He is oriented to person, place, and time. He appears well-developed and well-nourished. He does not appear ill. No distress.  HENT:  Head: Normocephalic and atraumatic.  Mouth/Throat: Oropharynx is clear and moist.  TMs clear with good landmarks, moderate nasal mucosa edema with clear rhinorrhea, posterior oropharynx clear and moist, with some erythema, no edema or exudates, uvula midline  Eyes: Right eye exhibits no discharge. Left eye exhibits no discharge.  Neck: Neck supple.  No rigidity  Cardiovascular: Normal rate, regular rhythm, normal heart sounds and intact distal pulses.  Pulmonary/Chest: Effort normal and breath sounds normal. No respiratory distress.  Respirations equal and unlabored, patient able to speak in full sentences, lungs clear to auscultation bilaterally, occasional cough during exam  Abdominal: Soft. Bowel sounds are normal. He exhibits no distension and no mass. There is no tenderness. There is no guarding.  Abdomen soft, nondistended, nontender to palpation in all quadrants without guarding or peritoneal signs  Musculoskeletal: He exhibits no deformity.  Lymphadenopathy:    He has no cervical adenopathy.  Neurological: He is alert and oriented to person, place, and time. Coordination normal.  Skin: Skin is warm and dry. Capillary refill takes less than 2 seconds. He is not diaphoretic.  Psychiatric: He has a normal mood and affect. His behavior is normal.  Nursing note and vitals reviewed.    ED Treatments / Results  Labs (all  labs ordered are listed, but only abnormal results are displayed) Labs Reviewed  GROUP A STREP BY PCR    EKG None  Radiology Dg Chest 2 View  Result Date: 01/24/2018 CLINICAL DATA:  Chest congestion and throat pain EXAM: CHEST - 2 VIEW COMPARISON:  August 26, 2017 FINDINGS: There is no edema or consolidation. Heart size and pulmonary vascularity are normal. No adenopathy. No pneumothorax. Stimulator is noted on the left anteriorly with lead tips in the lower left neck region. No bony lesions evident. IMPRESSION: No edema or consolidation. Electronically Signed   By: Bretta Bang III M.D.   On: 01/24/2018 12:29    Procedures Procedures (including critical care time)  Medications Ordered in ED Medications  ibuprofen (ADVIL,MOTRIN) tablet 600 mg (600 mg Oral Given 01/24/18 1233)     Initial Impression / Assessment and Plan / ED Course  I  have reviewed the triage vital signs and the nursing notes.  Pertinent labs & imaging results that were available during my care of the patient were reviewed by me and considered in my medical decision making (see chart for details).  Pt presents with nasal congestion and cough. Pt is well appearing and vitals are normal. Lungs CTA on exam. Pt CXR negative for acute infiltrate.  Symptoms seem most consistent with viral URI the patient is very concerned about strep throat, so strep PCR was ordered and was negative.  Discussed with patient that this is likely viral upper respiratory infection, discussed that antibiotics are not indicated for viral infections. Pt will be discharged with symptomatic treatment.  Verbalizes understanding and is agreeable with plan. Pt is hemodynamically stable & in NAD prior to dc.   Final Clinical Impressions(s) / ED Diagnoses   Final diagnoses:  Viral upper respiratory tract infection    ED Discharge Orders         Ordered    fexofenadine (ALLEGRA) 60 MG tablet  2 times daily     01/24/18 1325    fluticasone  (FLONASE) 50 MCG/ACT nasal spray  Daily     01/24/18 1325    guaiFENesin (MUCINEX) 600 MG 12 hr tablet  2 times daily     01/24/18 1325           Jodi Geralds [place], [place] 01/25/18 0932    Tegeler, Canary Brim, MD 01/25/18 1135

## 2018-02-12 ENCOUNTER — Other Ambulatory Visit: Payer: Self-pay

## 2018-02-12 ENCOUNTER — Emergency Department (HOSPITAL_BASED_OUTPATIENT_CLINIC_OR_DEPARTMENT_OTHER)
Admission: EM | Admit: 2018-02-12 | Discharge: 2018-02-12 | Disposition: A | Discharge status: Home / Self Care | Payer: Medicare Other | Attending: Emergency Medicine | Admitting: Emergency Medicine

## 2018-02-12 ENCOUNTER — Emergency Department (HOSPITAL_BASED_OUTPATIENT_CLINIC_OR_DEPARTMENT_OTHER): Payer: Medicare Other

## 2018-02-12 ENCOUNTER — Encounter (HOSPITAL_BASED_OUTPATIENT_CLINIC_OR_DEPARTMENT_OTHER): Payer: Self-pay | Admitting: *Deleted

## 2018-02-12 DIAGNOSIS — Z79899 Other long term (current) drug therapy: Secondary | ICD-10-CM | POA: Insufficient documentation

## 2018-02-12 DIAGNOSIS — Y929 Unspecified place or not applicable: Secondary | ICD-10-CM | POA: Diagnosis not present

## 2018-02-12 DIAGNOSIS — S52502A Unspecified fracture of the lower end of left radius, initial encounter for closed fracture: Secondary | ICD-10-CM

## 2018-02-12 DIAGNOSIS — W11XXXA Fall on and from ladder, initial encounter: Secondary | ICD-10-CM | POA: Diagnosis not present

## 2018-02-12 DIAGNOSIS — Y9389 Activity, other specified: Secondary | ICD-10-CM | POA: Diagnosis not present

## 2018-02-12 DIAGNOSIS — Y998 Other external cause status: Secondary | ICD-10-CM | POA: Insufficient documentation

## 2018-02-12 DIAGNOSIS — S6992XA Unspecified injury of left wrist, hand and finger(s), initial encounter: Secondary | ICD-10-CM | POA: Diagnosis present

## 2018-02-12 MED ORDER — HYDROCODONE-ACETAMINOPHEN 5-325 MG PO TABS: 1.0000 | ORAL_TABLET | Freq: Four times a day (QID) | ORAL | 0 refills | Status: DC | PRN

## 2018-02-12 MED ORDER — HYDROCODONE-ACETAMINOPHEN 5-325 MG PO TABS
1.0000 | ORAL_TABLET | Freq: Once | ORAL | Status: AC
  Administered 2018-02-12: 1 via ORAL
  Filled 2018-02-12: qty 1

## 2018-02-12 NOTE — ED Notes (Signed)
ED Provider at bedside. 

## 2018-02-12 NOTE — ED Provider Notes (Signed)
[facility] EMERGENCY DEPARTMENT Provider Note   CSN: [id number] Arrival date & time: 02/12/18  1812     History   Chief Complaint Chief Complaint  Patient presents with  . Wrist Pain    HPI Trevor Baker is a 27 y.o. male presenting for evaluation of left wrist pain.  Patient states he was on a ladder when it collapsed under him, and he fell landing on his left arm.  He reports acute onset left wrist pain.  This occurred just prior to arrival.  He denies injury elsewhere.  He denies hitting his head or loss of consciousness.  He has not taken anything for pain.  Movement and palpation makes the pain worse, nothing makes it better.  He denies numbness or tingling.  Patient states he has a history of seizures for which he takes multiple antiepileptics, no other medical problems.  HPI  Past Medical History:  Diagnosis Date  . Anemia   . Lactose intolerance   . Seizures (HCC)    last seizure in April 2014    There are no active problems to display for this patient.   Past Surgical History:  Procedure Laterality Date  . MANDIBLE FRACTURE SURGERY    . VAGUS NERVE STIMULATOR INSERTION Left    for seizure control   . WISDOM TOOTH EXTRACTION          Home Medications    Prior to Admission medications   Medication Sig Start Date End Date Taking? Authorizing Provider  calcium carbonate (CALCIUM 600) 600 MG TABS tablet Take by mouth.    [provider]  cloBAZam (ONFI) 10 MG tablet Take by mouth. 11/23/17   [provider]  felbamate (FELBATOL) 600 MG tablet Take 600 mg by mouth 3 (three) times daily.    [provider]  fexofenadine (ALLEGRA) 60 MG tablet Take 1 tablet (60 mg total) by mouth 2 (two) times daily. 01/24/18   Dartha Lodge, PA-C  fluticasone (FLONASE) 50 MCG/ACT nasal spray Place 2 sprays into both nostrils daily. 01/24/18   Dartha Lodge, PA-C  guaiFENesin (MUCINEX) 600 MG 12 hr tablet Take 1 tablet (600 mg total) by  mouth 2 (two) times daily. 01/24/18   Dartha Lodge, PA-C  HYDROcodone-acetaminophen (NORCO/VICODIN) 5-325 MG tablet Take 1 tablet by mouth every 6 (six) hours as needed for severe pain. 02/12/18   Lyndsey Demos, PA-C  lacosamide (VIMPAT) 200 MG TABS tablet Take 200 mg by mouth 2 (two) times daily.    [provider]  LORazepam (ATIVAN) 0.5 MG tablet Take 0.5 mg by mouth every 8 (eight) hours.    [provider]  phenytoin (PHENYTEK) 300 MG ER capsule Take by mouth. 08/30/13   [provider]  pregabalin (LYRICA) 300 MG capsule Take 150 mg by mouth 2 (two) times daily.     [provider]  primidone (MYSOLINE) 250 MG tablet Take 250 mg by mouth 4 (four) times daily.    [provider]    Family History History reviewed. No pertinent family history.  Social History Social History   Tobacco Use  . Smoking status: Never Smoker  . Smokeless tobacco: Never Used  Substance Use Topics  . Alcohol use: No  . Drug use: No     Allergies   Penicillins and Potiga [ezogabine]   Review of Systems Review of Systems  Musculoskeletal: Positive for arthralgias and joint swelling.  Neurological: Negative for numbness.  Hematological: Does not bruise/bleed easily.  Physical Exam Updated Vital Signs BP (!) 142/82 (BP Location: Right Arm)   Pulse 69   Temp 98.3 F (36.8 C) (Oral)   Resp 18   SpO2 99%   Physical Exam Vitals signs and nursing note reviewed.  Constitutional:      General: He is not in acute distress.    Appearance: He is well-developed.     Comments: Appears nontoxic  HENT:     Head: Normocephalic and atraumatic.  Eyes:     Conjunctiva/sclera: Conjunctivae normal.     Pupils: Pupils are equal, round, and reactive to light.  Neck:     Musculoskeletal: Normal range of motion and neck supple.  Cardiovascular:     Rate and Rhythm: Normal rate and regular rhythm.  Pulmonary:     Effort: Pulmonary effort is normal. No  respiratory distress.     Breath sounds: Normal breath sounds. No wheezing.  Abdominal:     General: There is no distension.  Musculoskeletal:        General: Swelling and tenderness present.     Comments: Swelling and tenderness noted of the left wrist, mostly on the medial side.  No tenderness palpation of the hand or forearm.  Good cap refill.  Sensation intact of distal fingers.  decreased range of motion due to pain.  Skin:    General: Skin is warm and dry.     Capillary Refill: Capillary refill takes less than 2 seconds.  Neurological:     Mental Status: He is alert and oriented to person, place, and time.     Sensory: No sensory deficit.      ED Treatments / Results  Labs (all labs ordered are listed, but only abnormal results are displayed) Labs Reviewed - No data to display  EKG None  Radiology Dg Wrist Complete Left  Result Date: 02/12/2018 CLINICAL DATA:  Left wrist pain and deformity secondary to a fall from a ladder. EXAM: LEFT WRIST - COMPLETE 3+ VIEW COMPARISON:  None. FINDINGS: There is a comminuted impacted fracture of the distal radius. The fracture involves the articular surface. The ulna is intact. Carpal bones are intact. IMPRESSION: Comminuted impacted fracture of the distal radius with extension to the articular surface. Electronically Signed   By: Francene BoyersJames  Maxwell M.D.   On: 02/12/2018 19:02    Procedures Procedures (including critical care time)  Medications Ordered in ED Medications  HYDROcodone-acetaminophen (NORCO/VICODIN) 5-325 MG per tablet 1 tablet (1 tablet Oral Given 02/12/18 2040)     Initial Impression / Assessment and Plan / ED Course  I have reviewed the triage vital signs and the nursing notes.  Pertinent labs & imaging results that were available during my care of the patient were reviewed by me and considered in my medical decision making (see chart for details).     Patient presenting for evaluation of wrist injury.  Physical exam  reassuring, is neurovascularly intact.  Obvious deformity.  X-ray viewed interpreted by me, shows fracture.  Per radiology read, patient with impacted and comminuted fracture extending into the intra-articular surface.  As such, will consult with hand surgery.  Discussed with Dr. Amanda PeaGramig, who recommends sugar tong splint, pain control, and follow-up in the office at 1 PM on Monday.  Discussed with patient. PMP checked, pt without inappropriate narcotic use (and regular refills for his antiepileptics).  At this time, patient appears safe for discharge.  Return precautions given.  Patient states he understands agrees to plan.  Final Clinical Impressions(s) / ED  Diagnoses   Final diagnoses:  None    ED Discharge Orders         Ordered    HYDROcodone-acetaminophen (NORCO/VICODIN) 5-325 MG tablet  Every 6 hours PRN     02/12/18 2123           Alveria ApleyCaccavale, Minahil Quinlivan, PA-C 02/12/18 2349    Tilden Fossaees, Elizabeth, MD 02/13/18 1123

## 2018-02-12 NOTE — ED Triage Notes (Signed)
Left wrist pain with deformity since falling while working on a roof.  No other injuries. Ice pack and arm board in place.

## 2018-02-12 NOTE — ED Notes (Signed)
Pt calling out requesting anticonvulsants. Provider notified.

## 2018-02-12 NOTE — Discharge Instructions (Signed)
Go to Dr. Carlos LeveringGramig's office at 1 PM on Monday.  This is very important. Keep the splint on until you see Dr. Amanda PeaGramig. Keep your arm elevated when able. Take ibuprofen 3 times a day with meals as needed for pain.  Do not take other anti-inflammatories at the same time (Advil, Motrin, naproxen, Aleve). Use norco as needed for severe or breakthrough pain. This is a narcotic, do not drive or operate heavy machinery while taking this medicine.  Return to the ER with any new, worsening, or concerning symptoms.

## 2018-02-15 ENCOUNTER — Other Ambulatory Visit: Payer: Self-pay

## 2018-02-15 ENCOUNTER — Encounter (HOSPITAL_COMMUNITY): Payer: Self-pay | Admitting: *Deleted
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
## 2018-02-15 NOTE — Progress Notes (Addendum)
Mr Trevor Baker denies chest pain or shortness of breath.  Patient has a lifelong history of seizures.  Patient had a Vagus Nerve Stimulator placed.  The last time patient had a seizure was in the spring of 2019- he had a fracture and was taking liquid medications. Prior to the seizure in Spring 2019- it had been 5 years since he had a seizure. Mr Trevor Baker reports that seizure medications has caused an elevation in ammonia in the past and he would have to take Lactulose. Patient's neurologist is Dr Gordy SaversMichael Amir [place].  I requested office notes.

## 2018-02-15 NOTE — Anesthesia Preprocedure Evaluation (Addendum)
Anesthesia Evaluation  Patient identified by MRN, date of birth, ID band Patient awake    Reviewed: Allergy & Precautions, NPO status , Patient's Chart, lab work & pertinent test results  Airway Mallampati: II  TM Distance: >3 FB Neck ROM: Full    Dental  (+) Poor Dentition, Dental Advisory Given   Pulmonary neg pulmonary ROS,    Pulmonary exam normal        Cardiovascular negative cardio ROS Normal cardiovascular exam     Neuro/Psych Seizures -, Well Controlled,  negative psych ROS   GI/Hepatic negative GI ROS, Neg liver ROS,   Endo/Other  negative endocrine ROS  Renal/GU negative Renal ROS  negative genitourinary   Musculoskeletal   Abdominal   Peds negative pediatric ROS (+)  Hematology negative hematology ROS (+)   Anesthesia Other Findings   Reproductive/Obstetrics negative OB ROS                            Anesthesia Physical Anesthesia Plan  ASA: II  Anesthesia Plan: General   Post-op Pain Management:  Regional for Post-op pain   Induction: Intravenous  PONV Risk Score and Plan: 2 and Ondansetron and Dexamethasone  Airway Management Planned: LMA  Additional Equipment:   Intra-op Plan:   Post-operative Plan: Extubation in OR  Informed Consent: I have reviewed the patients History and Physical, chart, labs and discussed the procedure including the risks, benefits and alternatives for the proposed anesthesia with the patient or authorized representative who has indicated his/her understanding and acceptance.   Dental advisory given  Plan Discussed with: CRNA and Anesthesiologist  Anesthesia Plan Comments: (PAT note written 02/15/2018 by Shonna ChockAllison Zelenak, PA-C. )      Anesthesia Quick Evaluation

## 2018-02-16 ENCOUNTER — Encounter (HOSPITAL_COMMUNITY): Payer: Self-pay | Admitting: Surgery

## 2018-02-16 ENCOUNTER — Ambulatory Visit (HOSPITAL_COMMUNITY): Payer: Medicare Other | Admitting: Vascular Surgery

## 2018-02-16 ENCOUNTER — Observation Stay (HOSPITAL_COMMUNITY)
Admission: RE | Admit: 2018-02-16 | Discharge: 2018-02-17 | Disposition: A | Discharge status: Home / Self Care | Payer: Medicare Other | Attending: Orthopedic Surgery | Admitting: Orthopedic Surgery

## 2018-02-16 ENCOUNTER — Encounter (HOSPITAL_COMMUNITY)
Admission: RE | Disposition: A | Discharge status: Home / Self Care | Payer: Self-pay | Source: Home / Self Care | Attending: Orthopedic Surgery

## 2018-02-16 DIAGNOSIS — R569 Unspecified convulsions: Secondary | ICD-10-CM | POA: Insufficient documentation

## 2018-02-16 DIAGNOSIS — S52572A Other intraarticular fracture of lower end of left radius, initial encounter for closed fracture: Principal | ICD-10-CM | POA: Insufficient documentation

## 2018-02-16 DIAGNOSIS — M25532 Pain in left wrist: Secondary | ICD-10-CM | POA: Insufficient documentation

## 2018-02-16 DIAGNOSIS — Z880 Allergy status to penicillin: Secondary | ICD-10-CM | POA: Insufficient documentation

## 2018-02-16 DIAGNOSIS — S52502A Unspecified fracture of the lower end of left radius, initial encounter for closed fracture: Secondary | ICD-10-CM | POA: Diagnosis present

## 2018-02-16 DIAGNOSIS — W11XXXA Fall on and from ladder, initial encounter: Secondary | ICD-10-CM | POA: Diagnosis not present

## 2018-02-16 DIAGNOSIS — Z79899 Other long term (current) drug therapy: Secondary | ICD-10-CM | POA: Diagnosis not present

## 2018-02-16 DIAGNOSIS — Z791 Long term (current) use of non-steroidal anti-inflammatories (NSAID): Secondary | ICD-10-CM | POA: Insufficient documentation

## 2018-02-16 HISTORY — PX: OPEN REDUCTION INTERNAL FIXATION (ORIF) DISTAL RADIAL FRACTURE: SHX5989

## 2018-02-16 HISTORY — DX: Unspecified injury of head, initial encounter: S09.90XA

## 2018-02-16 HISTORY — DX: Constipation, unspecified: K59.00

## 2018-02-16 LAB — COMPREHENSIVE METABOLIC PANEL
ALT: 18 U/L (ref 0–44)
AST: 21 U/L (ref 15–41)
Albumin: 3.8 g/dL (ref 3.5–5.0)
Alkaline Phosphatase: 62 U/L (ref 38–126)
Anion gap: 9 (ref 5–15)
BILIRUBIN TOTAL: 0.4 mg/dL (ref 0.3–1.2)
BUN: <5 mg/dL — ABNORMAL LOW (ref 6–20)
CO2: 26 mmol/L (ref 22–32)
Calcium: 8.5 mg/dL — ABNORMAL LOW (ref 8.9–10.3)
Chloride: 106 mmol/L (ref 98–111)
Creatinine, Ser: 0.89 mg/dL (ref 0.61–1.24)
GFR calc Af Amer: >60 mL/min (ref >60)
GFR calc non Af Amer: >60 mL/min (ref >60)
Glucose, Bld: 81 mg/dL (ref 70–99)
POTASSIUM: 3.7 mmol/L (ref 3.5–5.1)
Sodium: 141 mmol/L (ref 135–145)
TOTAL PROTEIN: 6.8 g/dL (ref 6.5–8.1)

## 2018-02-16 LAB — CBC
HEMATOCRIT: 36.5 % — AB (ref 39.0–52.0)
Hemoglobin: 11.7 g/dL — ABNORMAL LOW (ref 13.0–17.0)
MCH: 32.1 pg (ref 26.0–34.0)
MCHC: 32.1 g/dL (ref 30.0–36.0)
MCV: 100.3 fL — AB (ref 80.0–100.0)
Platelets: 211 10*3/uL (ref 150–400)
RBC: 3.64 MIL/uL — ABNORMAL LOW (ref 4.22–5.81)
RDW: 12.9 % (ref 11.5–15.5)
WBC: 3.4 10*3/uL — ABNORMAL LOW (ref 4.0–10.5)
nRBC: 0 % (ref 0.0–0.2)

## 2018-02-16 LAB — POCT I-STAT, CHEM 8
BUN: <3 mg/dL — ABNORMAL LOW (ref 6–20)
CHLORIDE: 102 mmol/L (ref 98–111)
Calcium, Ion: 1.16 mmol/L (ref 1.15–1.40)
Creatinine, Ser: 0.4 mg/dL — ABNORMAL LOW (ref 0.61–1.24)
Glucose, Bld: 60 mg/dL — ABNORMAL LOW (ref 70–99)
HCT: 23 % — ABNORMAL LOW (ref 39.0–52.0)
Hemoglobin: 7.8 g/dL — ABNORMAL LOW (ref 13.0–17.0)
Potassium: 4 mmol/L (ref 3.5–5.1)
Sodium: 136 mmol/L (ref 135–145)
TCO2: 22 mmol/L (ref 22–32)

## 2018-02-16 LAB — SURGICAL PCR SCREEN
MRSA, PCR: NEGATIVE
STAPHYLOCOCCUS AUREUS: NEGATIVE

## 2018-02-16 SURGERY — OPEN REDUCTION INTERNAL FIXATION (ORIF) DISTAL RADIUS FRACTURE
Anesthesia: General | Site: Wrist | Laterality: Left

## 2018-02-16 MED ORDER — CHLORHEXIDINE GLUCONATE 4 % EX LIQD: 60.0000 mL | Freq: Once | CUTANEOUS | Status: DC

## 2018-02-16 MED ORDER — LACTATED RINGERS IV SOLN
INTRAVENOUS | Status: DC | PRN
  Administered 2018-02-16: 11:00:00 via INTRAVENOUS

## 2018-02-16 MED ORDER — 0.9 % SODIUM CHLORIDE (POUR BTL) OPTIME
TOPICAL | Status: DC | PRN
  Administered 2018-02-16: 1000 mL

## 2018-02-16 MED ORDER — ONDANSETRON HCL 4 MG/2ML IJ SOLN: 4.0000 mg | Freq: Four times a day (QID) | INTRAMUSCULAR | Status: DC | PRN

## 2018-02-16 MED ORDER — ONDANSETRON HCL 4 MG/2ML IJ SOLN
INTRAMUSCULAR | Status: AC
  Filled 2018-02-16: qty 2

## 2018-02-16 MED ORDER — VITAMIN C 500 MG PO TABS
1000.0000 mg | ORAL_TABLET | Freq: Every day | ORAL | Status: DC
  Administered 2018-02-16 – 2018-02-17 (×2): 1000 mg via ORAL
  Filled 2018-02-16 (×2): qty 2

## 2018-02-16 MED ORDER — FAMOTIDINE 20 MG PO TABS: 20.0000 mg | ORAL_TABLET | Freq: Two times a day (BID) | ORAL | Status: DC | PRN

## 2018-02-16 MED ORDER — ONDANSETRON HCL 4 MG PO TABS: 4.0000 mg | ORAL_TABLET | Freq: Four times a day (QID) | ORAL | Status: DC | PRN

## 2018-02-16 MED ORDER — DOCUSATE SODIUM 100 MG PO CAPS
100.0000 mg | ORAL_CAPSULE | Freq: Two times a day (BID) | ORAL | Status: DC
  Administered 2018-02-16 – 2018-02-17 (×3): 100 mg via ORAL
  Filled 2018-02-16 (×3): qty 1

## 2018-02-16 MED ORDER — DEXAMETHASONE SODIUM PHOSPHATE 10 MG/ML IJ SOLN
INTRAMUSCULAR | Status: AC
  Filled 2018-02-16: qty 1

## 2018-02-16 MED ORDER — PROMETHAZINE HCL 12.5 MG RE SUPP
12.5000 mg | Freq: Four times a day (QID) | RECTAL | Status: DC | PRN
  Filled 2018-02-16: qty 1

## 2018-02-16 MED ORDER — MORPHINE SULFATE (PF) 2 MG/ML IV SOLN: 1.0000 mg | INTRAVENOUS | Status: DC | PRN

## 2018-02-16 MED ORDER — METHOCARBAMOL 500 MG PO TABS
500.0000 mg | ORAL_TABLET | Freq: Four times a day (QID) | ORAL | Status: DC | PRN
  Administered 2018-02-17: 500 mg via ORAL
  Filled 2018-02-16: qty 1

## 2018-02-16 MED ORDER — FELBAMATE 600 MG/5ML PO SUSP
600.0000 mg | Freq: Three times a day (TID) | ORAL | Status: DC
  Administered 2018-02-16 – 2018-02-17 (×3): 600 mg via ORAL
  Filled 2018-02-16 (×3): qty 5

## 2018-02-16 MED ORDER — LORAZEPAM 0.5 MG PO TABS
0.5000 mg | ORAL_TABLET | Freq: Three times a day (TID) | ORAL | Status: DC
  Administered 2018-02-16 – 2018-02-17 (×3): 0.5 mg via ORAL
  Filled 2018-02-16 (×3): qty 1

## 2018-02-16 MED ORDER — DEXAMETHASONE SODIUM PHOSPHATE 10 MG/ML IJ SOLN
INTRAMUSCULAR | Status: DC | PRN
  Administered 2018-02-16: 10 mg via INTRAVENOUS

## 2018-02-16 MED ORDER — PRIMIDONE 250 MG PO TABS
250.0000 mg | ORAL_TABLET | Freq: Two times a day (BID) | ORAL | Status: DC
  Administered 2018-02-16 – 2018-02-17 (×2): 250 mg via ORAL
  Filled 2018-02-16 (×2): qty 1

## 2018-02-16 MED ORDER — LACTATED RINGERS IV SOLN
INTRAVENOUS | Status: DC
  Administered 2018-02-16: 17:00:00 via INTRAVENOUS

## 2018-02-16 MED ORDER — CLOBAZAM 10 MG PO TABS
10.0000 mg | ORAL_TABLET | Freq: Two times a day (BID) | ORAL | Status: DC
  Administered 2018-02-17 (×2): 10 mg via ORAL
  Filled 2018-02-16 (×2): qty 1

## 2018-02-16 MED ORDER — ONDANSETRON HCL 4 MG/2ML IJ SOLN
INTRAMUSCULAR | Status: DC | PRN
  Administered 2018-02-16: 4 mg via INTRAVENOUS

## 2018-02-16 MED ORDER — VANCOMYCIN HCL IN DEXTROSE 1-5 GM/200ML-% IV SOLN
INTRAVENOUS | Status: AC
  Filled 2018-02-16: qty 200

## 2018-02-16 MED ORDER — PHENYTOIN SODIUM EXTENDED 100 MG PO CAPS
300.0000 mg | ORAL_CAPSULE | Freq: Every day | ORAL | Status: DC
  Administered 2018-02-16: 300 mg via ORAL
  Filled 2018-02-16: qty 3

## 2018-02-16 MED ORDER — MIDAZOLAM HCL 2 MG/2ML IJ SOLN
INTRAMUSCULAR | Status: AC
  Filled 2018-02-16: qty 2

## 2018-02-16 MED ORDER — FENTANYL CITRATE (PF) 100 MCG/2ML IJ SOLN
INTRAMUSCULAR | Status: DC | PRN
  Administered 2018-02-16 (×2): 50 ug via INTRAVENOUS

## 2018-02-16 MED ORDER — OXYCODONE HCL 5 MG PO TABS
ORAL_TABLET | ORAL | Status: AC
  Filled 2018-02-16: qty 2

## 2018-02-16 MED ORDER — FENTANYL CITRATE (PF) 250 MCG/5ML IJ SOLN
INTRAMUSCULAR | Status: AC
  Filled 2018-02-16: qty 5

## 2018-02-16 MED ORDER — CEFAZOLIN SODIUM-DEXTROSE 2-4 GM/100ML-% IV SOLN
2.0000 g | INTRAVENOUS | Status: DC
  Filled 2018-02-16: qty 100

## 2018-02-16 MED ORDER — LIDOCAINE-EPINEPHRINE (PF) 1.5 %-1:200000 IJ SOLN
INTRAMUSCULAR | Status: DC | PRN
  Administered 2018-02-16: 15 mL via PERINEURAL

## 2018-02-16 MED ORDER — PROPOFOL 10 MG/ML IV BOLUS
INTRAVENOUS | Status: DC | PRN
  Administered 2018-02-16: 200 mg via INTRAVENOUS

## 2018-02-16 MED ORDER — PROPOFOL 10 MG/ML IV BOLUS
INTRAVENOUS | Status: AC
  Filled 2018-02-16: qty 20

## 2018-02-16 MED ORDER — VANCOMYCIN HCL 1000 MG IV SOLR
INTRAVENOUS | Status: DC | PRN
  Administered 2018-02-16: 1000 mg via INTRAVENOUS

## 2018-02-16 MED ORDER — BUPIVACAINE HCL (PF) 0.25 % IJ SOLN
INTRAMUSCULAR | Status: DC | PRN
  Administered 2018-02-16: 15 mL via EPIDURAL

## 2018-02-16 MED ORDER — METHOCARBAMOL 1000 MG/10ML IJ SOLN
500.0000 mg | Freq: Four times a day (QID) | INTRAVENOUS | Status: DC | PRN
  Filled 2018-02-16: qty 5

## 2018-02-16 MED ORDER — LORATADINE 10 MG PO TABS
10.0000 mg | ORAL_TABLET | Freq: Every day | ORAL | Status: DC
  Administered 2018-02-17: 10 mg via ORAL
  Filled 2018-02-16: qty 1

## 2018-02-16 MED ORDER — LACOSAMIDE 200 MG PO TABS
200.0000 mg | ORAL_TABLET | Freq: Two times a day (BID) | ORAL | Status: DC
  Administered 2018-02-16 – 2018-02-17 (×2): 200 mg via ORAL
  Filled 2018-02-16 (×2): qty 1

## 2018-02-16 MED ORDER — DOCUSATE SODIUM 100 MG PO CAPS: 100.0000 mg | ORAL_CAPSULE | Freq: Two times a day (BID) | ORAL | Status: DC

## 2018-02-16 MED ORDER — OXYCODONE HCL 5 MG PO TABS
5.0000 mg | ORAL_TABLET | ORAL | Status: DC | PRN
  Administered 2018-02-16 – 2018-02-17 (×6): 10 mg via ORAL
  Filled 2018-02-16 (×5): qty 2

## 2018-02-16 MED ORDER — MIDAZOLAM HCL 5 MG/5ML IJ SOLN
INTRAMUSCULAR | Status: DC | PRN
  Administered 2018-02-16: 2 mg via INTRAVENOUS

## 2018-02-16 MED ORDER — VANCOMYCIN HCL IN DEXTROSE 1-5 GM/200ML-% IV SOLN
1000.0000 mg | Freq: Once | INTRAVENOUS | Status: AC
  Administered 2018-02-17: 1000 mg via INTRAVENOUS
  Filled 2018-02-16: qty 200

## 2018-02-16 SURGICAL SUPPLY — 66 items
BANDAGE ACE 3X5.8 VEL STRL LF (GAUZE/BANDAGES/DRESSINGS) ×4 IMPLANT
BANDAGE ACE 4X5 VEL STRL LF (GAUZE/BANDAGES/DRESSINGS) ×2 IMPLANT
BANDAGE ELASTIC 4 VELCRO ST LF (GAUZE/BANDAGES/DRESSINGS) ×4 IMPLANT
BIT DRILL 2.2 SS TIBIAL (BIT) ×2 IMPLANT
BLADE CLIPPER SURG (BLADE) IMPLANT
BNDG GAUZE ELAST 4 BULKY (GAUZE/BANDAGES/DRESSINGS) ×6 IMPLANT
CORDS BIPOLAR (ELECTRODE) ×2 IMPLANT
COVER SURGICAL LIGHT HANDLE (MISCELLANEOUS) ×2 IMPLANT
CUFF TOURNIQUET SINGLE 18IN (TOURNIQUET CUFF) ×2 IMPLANT
CUFF TOURNIQUET SINGLE 24IN (TOURNIQUET CUFF) IMPLANT
DRAIN TLS ROUND 10FR (DRAIN) IMPLANT
DRAPE OEC MINIVIEW 54X84 (DRAPES) ×2 IMPLANT
DRAPE U-SHAPE 47X51 STRL (DRAPES) ×2 IMPLANT
DRSG ADAPTIC 3X8 NADH LF (GAUZE/BANDAGES/DRESSINGS) ×2 IMPLANT
GAUZE SPONGE 4X4 12PLY STRL (GAUZE/BANDAGES/DRESSINGS) ×2 IMPLANT
GAUZE SPONGE 4X4 12PLY STRL LF (GAUZE/BANDAGES/DRESSINGS) ×2 IMPLANT
GAUZE XEROFORM 5X9 LF (GAUZE/BANDAGES/DRESSINGS) ×2 IMPLANT
GLOVE BIOGEL M 8.0 STRL (GLOVE) ×2 IMPLANT
GLOVE SS BIOGEL STRL SZ 8 (GLOVE) ×1 IMPLANT
GLOVE SUPERSENSE BIOGEL SZ 8 (GLOVE) ×1
GOWN STRL REUS W/ TWL LRG LVL3 (GOWN DISPOSABLE) ×3 IMPLANT
GOWN STRL REUS W/ TWL XL LVL3 (GOWN DISPOSABLE) ×3 IMPLANT
GOWN STRL REUS W/TWL LRG LVL3 (GOWN DISPOSABLE) ×3
GOWN STRL REUS W/TWL XL LVL3 (GOWN DISPOSABLE) ×3
KIT BASIN OR (CUSTOM PROCEDURE TRAY) ×2 IMPLANT
KIT TURNOVER KIT B (KITS) ×2 IMPLANT
MANIFOLD NEPTUNE II (INSTRUMENTS) ×2 IMPLANT
NEEDLE 22X1 1/2 (OR ONLY) (NEEDLE) IMPLANT
NS IRRIG 1000ML POUR BTL (IV SOLUTION) ×2 IMPLANT
PACK ORTHO EXTREMITY (CUSTOM PROCEDURE TRAY) ×2 IMPLANT
PAD ARMBOARD 7.5X6 YLW CONV (MISCELLANEOUS) ×4 IMPLANT
PAD CAST 4YDX4 CTTN HI CHSV (CAST SUPPLIES) ×1 IMPLANT
PADDING CAST COTTON 4X4 STRL (CAST SUPPLIES) ×1
PADDING CAST SYNTHETIC 4 (CAST SUPPLIES) ×1
PADDING CAST SYNTHETIC 4X4 STR (CAST SUPPLIES) ×1 IMPLANT
PEG LOCKING SMOOTH 2.2X14 (Peg) ×2 IMPLANT
PEG LOCKING SMOOTH 2.2X16 (Screw) ×2 IMPLANT
PEG LOCKING SMOOTH 2.2X18 (Peg) ×2 IMPLANT
PEG LOCKING SMOOTH 2.2X20 (Screw) ×4 IMPLANT
PEG LOCKING SMOOTH 2.2X22 (Screw) ×2 IMPLANT
PLATE STANDARD DVR LEFT (Plate) ×2 IMPLANT
PLATE STD DVR LT 24X51 (Plate) ×1 IMPLANT
SCREW LOCK 12X2.7X 3 LD (Screw) ×1 IMPLANT
SCREW LOCK 14X2.7X 3 LD TPR (Screw) ×1 IMPLANT
SCREW LOCK 16X2.7X 3 LD TPR (Screw) ×2 IMPLANT
SCREW LOCK 20X2.7X 3 LD TPR (Screw) ×1 IMPLANT
SCREW LOCKING 2.7X12MM (Screw) ×1 IMPLANT
SCREW LOCKING 2.7X14 (Screw) ×1 IMPLANT
SCREW LOCKING 2.7X15MM (Screw) ×2 IMPLANT
SCREW LOCKING 2.7X16 (Screw) ×2 IMPLANT
SCREW LOCKING 2.7X20MM (Screw) ×1 IMPLANT
SCRUB BETADINE 4OZ XXX (MISCELLANEOUS) ×2 IMPLANT
SOL PREP POV-IOD 4OZ 10% (MISCELLANEOUS) ×2 IMPLANT
SPLINT FIBERGLASS 3X12 (CAST SUPPLIES) ×2 IMPLANT
SPONGE LAP 4X18 RFD (DISPOSABLE) IMPLANT
SUT MNCRL AB 4-0 PS2 18 (SUTURE) ×2 IMPLANT
SUT PROLENE 3 0 PS 2 (SUTURE) IMPLANT
SUT PROLENE 4 0 PS 2 18 (SUTURE) ×4 IMPLANT
SUT VIC AB 3-0 FS2 27 (SUTURE) ×2 IMPLANT
SYSTEM CHEST DRAIN TLS 7FR (DRAIN) ×2 IMPLANT
TOWEL OR 17X24 6PK STRL BLUE (TOWEL DISPOSABLE) ×2 IMPLANT
TOWEL OR 17X26 10 PK STRL BLUE (TOWEL DISPOSABLE) ×2 IMPLANT
TUBE CONNECTING 12X1/4 (SUCTIONS) ×2 IMPLANT
TUBE EVACUATION TLS (MISCELLANEOUS) ×2 IMPLANT
UNDERPAD 30X30 (UNDERPADS AND DIAPERS) ×2 IMPLANT
WATER STERILE IRR 1000ML POUR (IV SOLUTION) ×2 IMPLANT

## 2018-02-16 NOTE — Transfer of Care (Signed)
Immediate Anesthesia Transfer of Care Note  Patient: Trevor Baker  Procedure(s) Performed: OPEN REDUCTION INTERNAL FIXATION (ORIF) DISTAL RADIAL FRACTURE (Left Wrist)  Patient Location: PACU  Anesthesia Type:GA combined with regional for post-op pain  Level of Consciousness: drowsy  Airway & Oxygen Therapy: Patient Spontanous Breathing and Patient connected to nasal cannula oxygen  Post-op Assessment: Report given to RN and Post -op Vital signs reviewed and stable  Post vital signs: Reviewed and stable  Last Vitals:  Vitals Value Taken Time  BP 112/59 02/16/2018 11:57 AM  Temp    Pulse 67 02/16/2018 12:01 PM  Resp 16 02/16/2018 12:01 PM  SpO2 100 % 02/16/2018 12:01 PM  Vitals shown include unvalidated device data.  Last Pain:  Vitals:   02/16/18 0914  TempSrc: Oral  PainSc:       Patients Stated Pain Goal: 3 (02/16/18 7357)  Complications: No apparent anesthesia complications

## 2018-02-16 NOTE — Op Note (Signed)
See dictation#[id number] SP ORIF left radius Trevor Soberanes MD

## 2018-02-16 NOTE — H&P (Signed)
Trevor Baker is an 28 y.o. male.   Chief Complaint: Patient presents with a comminuted complex history of fracture about the distal radius.  We will plan for left distal radius reconstruction with ORIF HPI: Patient presents for evaluation and treatment of the of their upper extremity predicament. The patient denies neck, back, chest or  abdominal pain. The patient notes that they have no lower extremity problems. The patients primary complaint is noted. We are planning surgical care pathway for the upper extremity.      We are planning surgery for your upper extremity. The risk and benefits of surgery to include risk of bleeding, infection, anesthesia,  damage to normal structures and failure of the surgery to accomplish its intended goals of relieving symptoms and restoring function have been discussed in detail. With this in mind we plan to proceed. I have specifically discussed with the patient the pre-and postoperative regime and the dos and don'ts and risk and benefits in great detail. Risk and benefits of surgery also include risk of dystrophy(CRPS), chronic nerve pain, failure of the healing process to go onto completion and other inherent risks of surgery The relavent the pathophysiology of the disease/injury process, as well as the alternatives for treatment and postoperative course of action has been discussed in great detail with the patient who desires to proceed.  We will do everything in our power to help you (the patient) restore function to the upper extremity. It is a pleasure to see this patient today.   Past Medical History:  Diagnosis Date  . Anemia   . Constipation    due to anti seizure  . Head injury    as a child- does not know details  . Lactose intolerance   . Seizures (HCC)    last seizure in April 2014  . Seizures (HCC)     Past Surgical History:  Procedure Laterality Date  . MANDIBLE FRACTURE SURGERY    . VAGUS NERVE STIMULATOR INSERTION Left    for  seizure control , changed x 1  . WISDOM TOOTH EXTRACTION      History reviewed. No pertinent family history. Social History:  reports that he has never smoked. He has never used smokeless tobacco. He reports that he does not drink alcohol or use drugs.  Allergies:  Allergies  Allergen Reactions  . Penicillins Hives  . Potiga [Ezogabine] Other (See Comments)    Urinary retention    Medications Prior to Admission  Medication Sig Dispense Refill  . calcium carbonate (CALCIUM 600) 600 MG TABS tablet Take by mouth.    . cloBAZam (ONFI) 10 MG tablet Take 10 mg by mouth 2 (two) times daily.     . felbamate (FELBATOL) 600 MG tablet Take 600 mg by mouth 3 (three) times daily.    . fexofenadine (ALLEGRA) 60 MG tablet Take 1 tablet (60 mg total) by mouth 2 (two) times daily. 30 tablet 0  . fluticasone (FLONASE) 50 MCG/ACT nasal spray Place 2 sprays into both nostrils daily. 16 g 0  . guaiFENesin (MUCINEX) 600 MG 12 hr tablet Take 1 tablet (600 mg total) by mouth 2 (two) times daily. 30 tablet 0  . HYDROcodone-acetaminophen (NORCO/VICODIN) 5-325 MG tablet Take 1 tablet by mouth every 6 (six) hours as needed for severe pain. 7 tablet 0  . lacosamide (VIMPAT) 200 MG TABS tablet Take 200 mg by mouth 2 (two) times daily.    Marland Kitchen. LORazepam (ATIVAN) 0.5 MG tablet Take 0.5 mg by mouth every 8 (  eight) hours.    . phenytoin (PHENYTEK) 300 MG ER capsule Take 300 mg by mouth daily. Takes around 2000    . pregabalin (LYRICA) 300 MG capsule Take 150 mg by mouth 2 (two) times daily.     . primidone (MYSOLINE) 250 MG tablet Take 250 mg by mouth 4 (four) times daily. Patient takes 2 times a day- change in treatment      No results found for this or any previous visit (from the past 48 hour(s)). No results found.  Review of Systems  Eyes: Negative.   Respiratory: Negative.   Cardiovascular: Negative.   Gastrointestinal: Negative.   Genitourinary: Negative.     There were no vitals taken for this  visit. Physical Exam  Left distal radius fracture comminuted complex.  Patient is neurovascularly intact. The patient is alert and oriented in no acute distress. The patient complains of pain in the affected upper extremity.  The patient is noted to have a normal HEENT exam. Lung fields show equal chest expansion and no shortness of breath. Abdomen exam is nontender without distention. Lower extremity examination does not show any fracture dislocation or blood clot symptoms. Pelvis is stable and the neck and back are stable and nontender. Assessment/Plan We are planning surgery for your upper extremity. The risk and benefits of surgery to include risk of bleeding, infection, anesthesia,  damage to normal structures and failure of the surgery to accomplish its intended goals of relieving symptoms and restoring function have been discussed in detail. With this in mind we plan to proceed. I have specifically discussed with the patient the pre-and postoperative regime and the dos and don'ts and risk and benefits in great detail. Risk and benefits of surgery also include risk of dystrophy(CRPS), chronic nerve pain, failure of the healing process to go onto completion and other inherent risks of surgery The relavent the pathophysiology of the disease/injury process, as well as the alternatives for treatment and postoperative course of action has been discussed in great detail with the patient who desires to proceed.  We will do everything in our power to help you (the patient) restore function to the upper extremity. It is a pleasure to see this patient today.   We will plan for reconstruction/ORIF left distal radius fracture  Oletta Cohn III, MD 02/16/2018, 7:45 AM

## 2018-02-16 NOTE — Plan of Care (Signed)

## 2018-02-16 NOTE — Anesthesia Postprocedure Evaluation (Signed)
Anesthesia Post Note  Patient: Trevor Baker  Procedure(s) Performed: OPEN REDUCTION INTERNAL FIXATION (ORIF) DISTAL RADIAL FRACTURE (Left Wrist)     Patient location during evaluation: PACU Anesthesia Type: General Level of consciousness: sedated Pain management: pain level controlled Vital Signs Assessment: post-procedure vital signs reviewed and stable Respiratory status: spontaneous breathing and respiratory function stable Cardiovascular status: stable Postop Assessment: no apparent nausea or vomiting Anesthetic complications: no    Last Vitals:  Vitals:   02/16/18 1215 02/16/18 1230  BP: 120/71 125/76  Pulse: 67 70  Resp: (!) 8 19  Temp:    SpO2: 100% 100%                   Ilda Laskin DANIEL

## 2018-02-16 NOTE — Anesthesia Procedure Notes (Addendum)
Anesthesia Regional Block: Supraclavicular block   Pre-Anesthetic Checklist: ,, timeout performed, Correct Patient, Correct Site, Correct Laterality, Correct Procedure, Correct Position, site marked, Risks and benefits discussed,  Surgical consent,  Pre-op evaluation,  At surgeon's request and post-op pain management  Laterality: Left  Prep: chloraprep       Needles:  Injection technique: Single-shot  Needle Type: Echogenic Stimulator Needle     Needle Length: 5cm  Needle Gauge: 22     Additional Needles:   Narrative:  Start time: 02/16/2018 10:32 AM End time: 02/16/2018 10:42 AM Injection made incrementally with aspirations every 5 mL.  Performed by: Personally  Anesthesiologist: Heather Roberts, MD  Additional Notes: Functioning IV was confirmed and monitors applied.  A 42mm 22ga echogenic arrow stimulator was used. Sterile prep and drape,hand hygiene and sterile gloves were used.Ultrasound guidance: relevant anatomy identified, needle position confirmed, local anesthetic spread visualized around nerve(s)., vascular puncture avoided.  Image printed for medical record.  Negative aspiration and negative test dose prior to incremental administration of local anesthetic. The patient tolerated the procedure well.

## 2018-02-16 NOTE — Anesthesia Procedure Notes (Signed)
Procedure Name: LMA Insertion Date/Time: 02/16/2018 10:50 AM Performed by: Waynard Edwards, CRNA Pre-anesthesia Checklist: Patient identified, Emergency Drugs available, Suction available and Patient being monitored Patient Re-evaluated:Patient Re-evaluated prior to induction Oxygen Delivery Method: Circle system utilized Preoxygenation: Pre-oxygenation with 100% oxygen Induction Type: IV induction Ventilation: Mask ventilation without difficulty LMA: LMA inserted LMA Size: 4.0 Number of attempts: 1 Placement Confirmation: positive ETCO2 and breath sounds checked- equal and bilateral Tube secured with: Tape Dental Injury: Teeth and Oropharynx as per pre-operative assessment

## 2018-02-17 ENCOUNTER — Encounter (HOSPITAL_COMMUNITY): Payer: Self-pay | Admitting: Orthopedic Surgery

## 2018-02-17 DIAGNOSIS — S52572A Other intraarticular fracture of lower end of left radius, initial encounter for closed fracture: Secondary | ICD-10-CM | POA: Diagnosis not present

## 2018-02-17 LAB — POCT I-STAT, CHEM 8
BUN: <3 mg/dL — ABNORMAL LOW (ref 6–20)
Calcium, Ion: 1.14 mmol/L — ABNORMAL LOW (ref 1.15–1.40)
Chloride: 103 mmol/L (ref 98–111)
Creatinine, Ser: 0.4 mg/dL — ABNORMAL LOW (ref 0.61–1.24)
Glucose, Bld: 56 mg/dL — ABNORMAL LOW (ref 70–99)
HCT: 20 % — ABNORMAL LOW (ref 39.0–52.0)
HEMOGLOBIN: 6.8 g/dL — AB (ref 13.0–17.0)
POTASSIUM: 4 mmol/L (ref 3.5–5.1)
Sodium: 135 mmol/L (ref 135–145)
TCO2: 21 mmol/L — ABNORMAL LOW (ref 22–32)

## 2018-02-17 NOTE — Discharge Summary (Signed)
Physician Discharge Summary  Patient ID: Trevor Baker MRN: [id number] DOB/AGE: 1990/10/11 28 y.o.  Admit date: 02/16/2018 Discharge date:   Admission Diagnoses: left wrist fracture Past Medical History:  Diagnosis Date  . Anemia   . Constipation    due to anti seizure  . Head injury    as a child- does not know details  . Lactose intolerance   . Seizures (HCC)    last seizure in April 2014  . Seizures (HCC)     Discharge Diagnoses:  Active Problems:   Closed fracture of distal end of left radius, initial encounter   Surgeries: Procedure(s): OPEN REDUCTION INTERNAL FIXATION (ORIF) DISTAL RADIAL FRACTURE on 02/16/2018    Consultants:   Discharged Condition: Improved  Hospital Course: Trevor Baker is an 28 y.o. male who was admitted 02/16/2018 with a chief complaint of No chief complaint on file. , and found to have a diagnosis of left wrist fracture.  They were brought to the operating room on 02/16/2018 and underwent Procedure(s): OPEN REDUCTION INTERNAL FIXATION (ORIF) DISTAL RADIAL FRACTURE.    They were given perioperative antibiotics:  Anti-infectives (From admission, onward)   Start     Dose/Rate Route Frequency Ordered Stop   02/16/18 2300  vancomycin (VANCOCIN) IVPB 1000 mg/200 mL premix     1,000 mg 200 mL/hr over 60 Minutes Intravenous  Once 02/16/18 1503 02/17/18 0116   02/16/18 0745  ceFAZolin (ANCEF) IVPB 2g/100 mL premix  Status:  Discontinued     2 g 200 mL/hr over 30 Minutes Intravenous On call to O.R. 02/16/18 [id number] 02/16/18 1449    .  They were given sequential compression devices, early ambulation, and Other (comment) for DVT prophylaxis.  Recent vital signs:  Patient Vitals for the past 24 hrs:  BP Temp Temp src Pulse Resp SpO2 Height Weight  02/17/18 0239 123/66 (!) 97.5 F (36.4 C) Oral 77 18 100 % - -  02/16/18 2041 126/68 98.3 F (36.8 C) Oral 91 18 97 % - -  02/16/18 1500 - - - - - - 5\' 11"  (1.803 m) 81.6 kg  02/16/18 1456 128/83 97.8 F  (36.6 C) Oral 66 - 97 % - -  02/16/18 1430 124/77 - - 67 18 100 % - -  02/16/18 1400 113/71 - - (!) 57 16 99 % - -  02/16/18 1330 118/71 - - (!) 59 (!) 5 98 % - -  02/16/18 1300 - - - 64 15 100 % - -  02/16/18 1230 125/76 - - 70 19 100 % - -  02/16/18 1215 120/71 - - 67 (!) 8 100 % - -  02/16/18 1200 (!) 112/59 (!) 97.5 F (36.4 C) - 65 16 100 % - -  02/16/18 0914 126/73 97.8 F (36.6 C) Oral - 18 99 % - -  .  Recent laboratory studies: No results found.  Discharge Medications:   Allergies as of 02/17/2018      Reactions   Penicillins Hives   Potiga [ezogabine] Other (See Comments)   Urinary retention      Medication List    STOP taking these medications   fluticasone 50 MCG/ACT nasal spray Commonly known as:  FLONASE   HYDROcodone-acetaminophen 5-325 MG tablet Commonly known as:  NORCO/VICODIN     TAKE these medications   CALCIUM 600 600 MG Tabs tablet Generic drug:  calcium carbonate Take by mouth.   cloBAZam 10 MG tablet Commonly known as:  ONFI Take 10 mg by mouth 2 (two)  times daily.   felbamate 600 MG tablet Commonly known as:  FELBATOL Take 600 mg by mouth 3 (three) times daily.   fexofenadine 60 MG tablet Commonly known as:  ALLEGRA Take 1 tablet (60 mg total) by mouth 2 (two) times daily.   guaiFENesin 600 MG 12 hr tablet Commonly known as:  MUCINEX Take 1 tablet (600 mg total) by mouth 2 (two) times daily.   ibuprofen 600 MG tablet Commonly known as:  ADVIL,MOTRIN Take 800 mg by mouth every 8 (eight) hours as needed for moderate pain.   lacosamide 200 MG Tabs tablet Commonly known as:  VIMPAT Take 200 mg by mouth 2 (two) times daily.   LORazepam 0.5 MG tablet Commonly known as:  ATIVAN Take 0.5 mg by mouth every 8 (eight) hours.   PHENYTEK 300 MG ER capsule Generic drug:  phenytoin Take 300 mg by mouth daily.   primidone 250 MG tablet Commonly known as:  MYSOLINE Take 250 mg by mouth 2 (two) times daily.       Diagnostic Studies:  Dg Chest 2 View  Result Date: 01/24/2018 CLINICAL DATA:  Chest congestion and throat pain EXAM: CHEST - 2 VIEW COMPARISON:  August 26, 2017 FINDINGS: There is no edema or consolidation. Heart size and pulmonary vascularity are normal. No adenopathy. No pneumothorax. Stimulator is noted on the left anteriorly with lead tips in the lower left neck region. No bony lesions evident. IMPRESSION: No edema or consolidation. Electronically Signed   By: Bretta Bang Baker M.D.   On: 01/24/2018 12:29   Dg Wrist Complete Left  Result Date: 02/12/2018 CLINICAL DATA:  Left wrist pain and deformity secondary to a fall from a ladder. EXAM: LEFT WRIST - COMPLETE 3+ VIEW COMPARISON:  None. FINDINGS: There is a comminuted impacted fracture of the distal radius. The fracture involves the articular surface. The ulna is intact. Carpal bones are intact. IMPRESSION: Comminuted impacted fracture of the distal radius with extension to the articular surface. Electronically Signed   By: Francene Boyers M.D.   On: 02/12/2018 19:02    They benefited maximally from their hospital stay and there were no complications.     Disposition: Discharge disposition: 01-Home or Self Care      Discharge Instructions    Call MD / Call 911   Complete by:  As directed    If you experience chest pain or shortness of breath, CALL 911 and be transported to the hospital emergency room.  If you develope a fever above 101 F, pus (white drainage) or increased drainage or redness at the wound, or calf pain, call your surgeon's office.   Constipation Prevention   Complete by:  As directed    Drink plenty of fluids.  Prune juice may be helpful.  You may use a stool softener, such as Colace (over the counter) 100 mg twice a day.  Use MiraLax (over the counter) for constipation as needed.   Diet - low sodium heart healthy   Complete by:  As directed    Increase activity slowly as tolerated   Complete by:  As directed      Follow-up  Information    Dominica Severin, MD Follow up in 14 day(s).   Specialty:  Orthopedic Surgery Why:  We will call for your follow-up appointment in 14 days Contact information: [address] [place] [address] [phone]         Patient has been seen and examined. Patient has pain appropriate to  his injury/process. Patient denies new complaints at this present time. I have discussed the care pathway with nursing staff. Patient is appropriate and alert.  We reviewed vital signs and intake output which are stable.  The upper extremity is neurovascularly intact. Refill is normal. There is no signs of compartment syndrome. There is no signs of dystrophy. There is normal sensation.  I have spent a  great deal of time discussing range of motion edema control and other techniques to decrease edema and promote flexion extension of the fingers. Patient understands the importance of elevation range of motion massage and other measures to lessen pain and prevent swelling.  We have also discussed immobilization to appropriate areas involved.  We have discussed with the patient shoulder range of motion to prevent adhesive capsulitis.  The remainder of the examination is normal today without complicating feature.  Drain was removed without difficulty  Patient will be discharged home. Will plan to see the patient back in the office as per discharge instructions (please see discharge instructions).  Patient had an uneventful hospital course. At the time of discharge patient is stable awake alert and oriented in no acute distress. Regular diet will be continued and has been tolerated. Patient will notify should have problems occur. There is no signs of DVT infection or other complication at this juncture.  All questions have been incurred and answered.  Please see discharge med list  Dorenda Pfannenstiel MD  Signed: Oletta Cohn Baker 02/17/2018, 6:57 AM

## 2018-02-17 NOTE — Progress Notes (Signed)
Patient accidentally pulled red top drain out.  Surgeon on call notified with orders to monitor closely.

## 2018-02-17 NOTE — Op Note (Signed)
NAMEKORY, MOSTELLER MEDICAL RECORD [id number] ACCOUNT [id number] DATE OF BIRTH:03-Jan-1991 FACILITY: [facility] LOCATION: [facility] PHYSICIAN:Kamaljit Hizer M. Bentlee Drier, MD  OPERATIVE REPORT  DATE OF PROCEDURE:  02/16/2018  PREOPERATIVE DIAGNOSES:  Left comminuted complex intraarticular distal radius fracture, greater than 3-part.  POSTOPERATIVE DIAGNOSES:  Left comminuted complex intraarticular distal radius fracture, greater than 3-part.  PROCEDURE: 1.  Open reduction internal fixation left distal radius fracture, greater than 3-part intraarticular fracture with DVR Biomet plate and screw construct. 2.  Five view radiographic series, ____wrist, performed, examined and interpreted by myself.  SURGEON:  Dominica Severin, MD  ASSISTANT:  None.  COMPLICATIONS:  None.  ANESTHESIA:  General with preoperative block.  TOURNIQUET TIME:  Less than 40 minutes.  INDICATIONS:  A 28 year old male with the above-mentioned diagnosis.  I have counseled him regarding risks and benefits of surgery and he desires to proceed with the above-mentioned operative intervention.  All questions have been encouraged and answered  preoperatively.  DESCRIPTION OF PROCEDURE:  The patient was seen by myself and anesthesia and taken to the operative theater and underwent a preoperative block by Dr. Krista Blue and then underwent a general anesthetic in the operative theater.  He was prepped with Hibiclens  prescrub followed by a 10-minute surgical Betadine scrub and paint, followed by timeout being observed and tourniquet insufflation.  A volar radial incision was made.  Dissection was carried down, and following this, the patient then underwent FCR tendon  sheath decompression dorsally and palmarly.  Carpal canal contents were retracted ulnarly.  Pronator incised.  Fracture reduced, held stabilized and a DVR standard plate was applied without difficulty.  This achieved radial height, inclination and volar  tilt to my satisfaction  without difficulty and there were no complicating features.  Following this, the patient then underwent a very careful and cautious approach to irrigation followed by pronator closure.  Thus ORIF standard DVR plate was accomplished without difficulty.  The patient tolerated this well.  The pronator was closed  nicely.  Bleeding was stable and bipolar cautery was used for purposes of coagulation.  The patient had the skin closed with Prolene.  Standard short arm splint was applied.  Once again, intraoperative fluoroscopy and 5-view radiographic series performed, examined and interpreted by myself looked to be excellent.  We will see him back in 2 weeks.  Standard DVR protocol will be adhered to.  He will notify me should any problems occur.  Dos and don'ts have been discussed and all questions have been addressed.  He will be spending the night for IV antibiotics and  general postoperative observation given his home needs, etc.  JN/NUANCE  D:02/16/2018 T:02/17/2018 JOB:[id number]

## 2018-02-17 NOTE — Evaluation (Signed)
Occupational Therapy Evaluation and Discharge Patient Details Name: Trevor Baker MRN: [id number] DOB: [birth year] Today's Date: 02/17/2018    History of Present Illness Pt is a 28 y/o male with a comminuted complex history of fracture about the distal radius. Pt is now s/p ORIF LUE.    Clinical Impression   PTA Pt independent in ADL and mobility. Pt educated on compensatory strategies for ADL, smart clothing choices, and sequencing. Pt also educated on edema management with elevation and exercises. Pt was able to perform toilet transfer, sink level grooming, get fully dressed, and verbalize/demonstrate understanding of education. Education complete and OT is thankful to have the opportunity to have worked with this patient.     Follow Up Recommendations  Follow surgeon's recommendation for DC plan and follow-up therapies;Supervision - Intermittent    Equipment Recommendations  None recommended by OT    Recommendations for Other Services       Precautions / Restrictions Restrictions Weight Bearing Restrictions: Yes LUE Weight Bearing: Non weight bearing      Mobility Bed Mobility Overal bed mobility: Independent                Transfers Overall transfer level: Independent                    Balance Overall balance assessment: Independent                                         ADL either performed or assessed with clinical judgement   ADL Overall ADL's : At baseline                                       General ADL Comments: has been living at home since the ladder caused this fx. Needs minor help with items like buttons. Educated on Nutritional therapist choices, Compensatory strategies for ADL (sequencing, showering etc). Also focused education on edema management with elevation and exercises of fingers and elbow as well as massaging his fingers     Vision Patient Visual Report: No change from baseline       Perception      Praxis      Pertinent Vitals/Pain Pain Assessment: 0-10 Pain Score: 7  Pain Location: L wrist Pain Descriptors / Indicators: Dull;Aching Pain Intervention(s): Monitored during session;Repositioned     Hand Dominance Right   Extremity/Trunk Assessment Upper Extremity Assessment Upper Extremity Assessment: LUE deficits/detail LUE Deficits / Details: ROM for fingers and elbow as wrapping will allow - otherwise deficits as anticipated post-op LUE Sensation: WNL   Lower Extremity Assessment Lower Extremity Assessment: Overall WFL for tasks assessed   Cervical / Trunk Assessment Cervical / Trunk Assessment: Normal   Communication Communication Communication: No difficulties   Cognition Arousal/Alertness: Awake/alert Behavior During Therapy: Flat affect Overall Cognitive Status: Within Functional Limits for tasks assessed                                     General Comments       Exercises Exercises: Other exercises Other Exercises Other Exercises: fingers flexion/extension as wrapping will allow Other Exercises: thumb movement as wrapping will allow Other Exercises: elbow flexion/extension   Shoulder Instructions      Home  Living Family/patient expects to be discharged to:: Private residence Living Arrangements: Alone Available Help at Discharge: Friend(s);Available PRN/intermittently Type of Home: Apartment Home Access: Stairs to enter Entrance Stairs-Number of Steps: flight   Home Layout: One level     Bathroom Shower/Tub: Chief Strategy OfficerTub/shower unit   Bathroom Toilet: Standard     Home Equipment: None          Prior Functioning/Environment Level of Independence: Independent        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        OT Problem List: Impaired UE functional use;Pain      OT Treatment/Interventions:      OT Goals(Current goals can be found in the care plan section) Acute Rehab OT Goals Patient Stated Goal: get home, heal, and find full  time employment OT Goal Formulation: With patient Time For Goal Achievement: 03/03/18 Potential to Achieve Goals: Good  OT Frequency:     Barriers to D/C:            Co-evaluation              AM-PAC OT "6 Clicks" Daily Activity     Outcome Measure Help from another person eating meals?: None Help from another person taking care of personal grooming?: None Help from another person toileting, which includes using toliet, bedpan, or urinal?: None Help from another person bathing (including washing, rinsing, drying)?: None Help from another person to put on and taking off regular upper body clothing?: A Little Help from another person to put on and taking off regular lower body clothing?: None 6 Click Score: 23   End of Session Nurse Communication: Mobility status  Activity Tolerance: Patient tolerated treatment well Patient left: in chair;with call bell/phone within reach  OT Visit Diagnosis: Pain Pain - Right/Left: Left Pain - part of body: Arm;Hand                Time: [id number] OT Time Calculation (min): 31 min Charges:  OT General Charges $OT Visit: 1 Visit OT Evaluation $OT Eval Low Complexity: 1 Low  Sherryl MangesLaura Jamacia Jester OTR/L Acute Rehabilitation Services Pager: [phone] Office: [phone]  Trevor BioLaura J Willey Due 02/17/2018, 11:57 AM

## 2018-02-17 NOTE — Progress Notes (Signed)
Patient discharging home. Discharge instructions explained to patient and he verbalized understanding. Took all personal belongings. No further questions or concerns voiced.  

## 2018-02-17 NOTE — Discharge Instructions (Signed)

## 2018-06-08 ENCOUNTER — Emergency Department (HOSPITAL_BASED_OUTPATIENT_CLINIC_OR_DEPARTMENT_OTHER): Payer: Medicare Other

## 2018-06-08 ENCOUNTER — Emergency Department (HOSPITAL_BASED_OUTPATIENT_CLINIC_OR_DEPARTMENT_OTHER)
Admission: EM | Admit: 2018-06-08 | Discharge: 2018-06-08 | Disposition: A | Discharge status: Home / Self Care | Payer: Medicare Other | Attending: Emergency Medicine | Admitting: Emergency Medicine

## 2018-06-08 ENCOUNTER — Encounter (HOSPITAL_BASED_OUTPATIENT_CLINIC_OR_DEPARTMENT_OTHER): Payer: Self-pay | Admitting: Emergency Medicine

## 2018-06-08 ENCOUNTER — Other Ambulatory Visit: Payer: Self-pay

## 2018-06-08 DIAGNOSIS — Z79899 Other long term (current) drug therapy: Secondary | ICD-10-CM | POA: Diagnosis not present

## 2018-06-08 DIAGNOSIS — R51 Headache: Secondary | ICD-10-CM | POA: Diagnosis not present

## 2018-06-08 DIAGNOSIS — R079 Chest pain, unspecified: Secondary | ICD-10-CM | POA: Insufficient documentation

## 2018-06-08 DIAGNOSIS — R0789 Other chest pain: Secondary | ICD-10-CM | POA: Diagnosis not present

## 2018-06-08 DIAGNOSIS — R519 Headache, unspecified: Secondary | ICD-10-CM

## 2018-06-08 LAB — BASIC METABOLIC PANEL
Anion gap: 7 (ref 5–15)
BUN: 9 mg/dL (ref 6–20)
CO2: 26 mmol/L (ref 22–32)
Calcium: 8.8 mg/dL — ABNORMAL LOW (ref 8.9–10.3)
Chloride: 104 mmol/L (ref 98–111)
Creatinine, Ser: 0.76 mg/dL (ref 0.61–1.24)
GFR calc Af Amer: >60 mL/min (ref >60)
GFR calc non Af Amer: >60 mL/min (ref >60)
Glucose, Bld: 113 mg/dL — ABNORMAL HIGH (ref 70–99)
Potassium: 3.6 mmol/L (ref 3.5–5.1)
Sodium: 137 mmol/L (ref 135–145)

## 2018-06-08 LAB — CBC
HCT: 41.9 % (ref 39.0–52.0)
Hemoglobin: 13.9 g/dL (ref 13.0–17.0)
MCH: 32.8 pg (ref 26.0–34.0)
MCHC: 33.2 g/dL (ref 30.0–36.0)
MCV: 98.8 fL (ref 80.0–100.0)
Platelets: 211 10*3/uL (ref 150–400)
RBC: 4.24 MIL/uL (ref 4.22–5.81)
RDW: 12.4 % (ref 11.5–15.5)
WBC: 3.5 10*3/uL — ABNORMAL LOW (ref 4.0–10.5)
nRBC: 0 % (ref 0.0–0.2)

## 2018-06-08 LAB — TROPONIN I: Troponin I: <0.03 ng/mL (ref <0.03)

## 2018-06-08 MED ORDER — KETOROLAC TROMETHAMINE 30 MG/ML IJ SOLN
30.0000 mg | Freq: Once | INTRAMUSCULAR | Status: AC
  Administered 2018-06-08: 14:00:00 30 mg via INTRAVENOUS
  Filled 2018-06-08: qty 1

## 2018-06-08 MED ORDER — DIPHENHYDRAMINE HCL 50 MG/ML IJ SOLN
25.0000 mg | Freq: Once | INTRAMUSCULAR | Status: AC
  Administered 2018-06-08: 25 mg via INTRAVENOUS
  Filled 2018-06-08: qty 1

## 2018-06-08 MED ORDER — PROCHLORPERAZINE EDISYLATE 10 MG/2ML IJ SOLN
10.0000 mg | Freq: Once | INTRAMUSCULAR | Status: AC
  Administered 2018-06-08: 15:00:00 10 mg via INTRAVENOUS
  Filled 2018-06-08: qty 2

## 2018-06-08 MED ORDER — CYCLOBENZAPRINE HCL 10 MG PO TABS: 10.0000 mg | ORAL_TABLET | Freq: Two times a day (BID) | ORAL | 0 refills | Status: DC | PRN

## 2018-06-08 MED ORDER — CYCLOBENZAPRINE HCL 10 MG PO TABS
10.0000 mg | ORAL_TABLET | Freq: Once | ORAL | Status: AC
  Administered 2018-06-08: 10 mg via ORAL
  Filled 2018-06-08: qty 1

## 2018-06-08 MED ORDER — IBUPROFEN 400 MG PO TABS: 400.0000 mg | ORAL_TABLET | Freq: Four times a day (QID) | ORAL | 0 refills | Status: DC | PRN

## 2018-06-08 NOTE — ED Triage Notes (Addendum)
Intermittent chest pain x 2 weeks. Also high BP readings at home. States the pain got worse after getting searched by police.

## 2018-06-08 NOTE — ED Provider Notes (Signed)
[facility] EMERGENCY DEPARTMENT Provider Note   CSN: [id number] Arrival date & time: 06/08/18  1332    History   Chief Complaint Chief Complaint  Patient presents with  . Chest Pain    HPI Trevor Baker is a 28 y.o. male.     HPI   Chest pain has been intermittent over the last few weeks Last night was more severe, started around 8PM, felt heart racing, chest pain and headache Was vagus nerve stimulator and ran it over the vagal nerve stimulator and not sure if that is contributing to it Top number BP in 180s on other monitor, 140s on monitor he brought with him, HR in 70s-80s Chest pain starts while resting, feels like someone hitting him with a hammer, a stinging pain, starts left upper chest and radiates across right chest, stinging and shooting at the same time, doesn't move anywhere else, no radiation to arm/back/jaw.  Last night lasted a few hours, usually lasts a few hours, sometimes will have palpitations, BP have been elevated during episodes  No shortness of breath or nausea No abdominal pain, cough or fever     Strokes and CAD running in family Grandfather had CAD, grandmother had strokes, no one known in immediate family with CAD/MI  No smoking, no other drugs, no etoh  Had radial fx with Dr. Amanda Pea in January, outpt, no other surgeries, hospitalizations, long trips or immobilization, no leg pain or swelling  Reports increased stress and asks if symptoms could be secondary to stress or anxiety.  Past Medical History:  Diagnosis Date  . Anemia   . Constipation    due to anti seizure  . Head injury    as a child- does not know details  . Lactose intolerance   . Seizures (HCC)    last seizure in April 2014  . Seizures [facility])     Patient Active Problem List   Diagnosis Date Noted  . Closed fracture of distal end of left radius, initial encounter 02/16/2018    Past Surgical History:  Procedure Laterality Date  . MANDIBLE FRACTURE SURGERY     . OPEN REDUCTION INTERNAL FIXATION (ORIF) DISTAL RADIAL FRACTURE Left 02/16/2018   Procedure: OPEN REDUCTION INTERNAL FIXATION (ORIF) DISTAL RADIAL FRACTURE;  Surgeon: Dominica Severin, MD;  Location: [facility] OR;  Service: Orthopedics;  Laterality: Left;  Marland Kitchen VAGUS NERVE STIMULATOR INSERTION Left    for seizure control , changed x 1  . WISDOM TOOTH EXTRACTION          Home Medications    Prior to Admission medications   Medication Sig Start Date End Date Taking? Authorizing Provider  cloBAZam (ONFI) 10 MG tablet Take 10 mg by mouth 2 (two) times daily.  11/23/17  Yes [provider]  felbamate (FELBATOL) 600 MG tablet Take 600 mg by mouth 3 (three) times daily.   Yes [provider]  guaiFENesin (MUCINEX) 600 MG 12 hr tablet Take 1 tablet (600 mg total) by mouth 2 (two) times daily. 01/24/18  Yes Dartha Lodge, PA-C  lacosamide (VIMPAT) 200 MG TABS tablet Take 200 mg by mouth 2 (two) times daily.   Yes [provider]  traZODone (DESYREL) 50 MG tablet Take 50 mg by mouth at bedtime as needed for sleep.   Yes [provider]  calcium carbonate (CALCIUM 600) 600 MG TABS tablet Take by mouth.    [provider]  cyclobenzaprine (FLEXERIL) 10 MG tablet Take 1 tablet (10 mg total) by mouth 2 (  two) times daily as needed for muscle spasms. 06/08/18   Alvira Monday, MD  fexofenadine (ALLEGRA) 60 MG tablet Take 1 tablet (60 mg total) by mouth 2 (two) times daily. 01/24/18   Dartha Lodge, PA-C  ibuprofen (ADVIL) 400 MG tablet Take 1 tablet (400 mg total) by mouth every 6 (six) hours as needed. 06/08/18   Alvira Monday, MD  LORazepam (ATIVAN) 0.5 MG tablet Take 0.5 mg by mouth every 8 (eight) hours.    [provider]  phenytoin (PHENYTEK) 300 MG ER capsule Take 300 mg by mouth daily.  08/30/13   [provider]  primidone (MYSOLINE) 250 MG tablet Take 250 mg by mouth 2 (two) times daily.     [provider]    Family History No  family history on file.  Social History Social History   Tobacco Use  . Smoking status: Never Smoker  . Smokeless tobacco: Never Used  Substance Use Topics  . Alcohol use: No  . Drug use: No     Allergies   Citalopram; Penicillins; and Potiga [ezogabine]   Review of Systems Review of Systems  Constitutional: Negative for fever.  HENT: Negative for sore throat.   Eyes: Negative for visual disturbance.  Respiratory: Negative for shortness of breath.   Cardiovascular: Positive for chest pain.  Gastrointestinal: Negative for abdominal pain, nausea and vomiting.  Genitourinary: Negative for difficulty urinating.  Musculoskeletal: Negative for back pain and neck stiffness.  Skin: Negative for rash.  Neurological: Positive for headaches. Negative for syncope.     Physical Exam Updated Vital Signs BP 127/69   Pulse 73   Temp 98.1 F (36.7 C) (Oral)   Resp (!) 21   Ht  (1.854 m)   Wt 81.6 kg   SpO2 100%   BMI 23.75 kg/m   Physical Exam Vitals signs and nursing note reviewed.  Constitutional:      General: He is not in acute distress.    Appearance: He is well-developed. He is not diaphoretic.  HENT:     Head: Normocephalic and atraumatic.  Eyes:     Conjunctiva/sclera: Conjunctivae normal.  Neck:     Musculoskeletal: Normal range of motion.  Cardiovascular:     Rate and Rhythm: Normal rate and regular rhythm.     Heart sounds: Normal heart sounds. No murmur. No friction rub. No gallop.   Pulmonary:     Effort: Pulmonary effort is normal. No respiratory distress.     Breath sounds: Normal breath sounds. No wheezing or rales.  Chest:     Chest wall: Tenderness present.  Abdominal:     General: There is no distension.     Palpations: Abdomen is soft.     Tenderness: There is no abdominal tenderness. There is no guarding.  Skin:    General: Skin is warm and dry.  Neurological:     Mental Status: He is alert and oriented to person, place, and time.       ED Treatments / Results  Labs (all labs ordered are listed, but only abnormal results are displayed) Labs Reviewed  CBC - Abnormal; Notable for the following components:      Result Value   WBC 3.5 (*)    All other components within normal limits  BASIC METABOLIC PANEL - Abnormal; Notable for the following components:   Glucose, Bld 113 (*)    Calcium 8.8 (*)    All other components within normal limits  TROPONIN I  BASIC METABOLIC  PANEL    EKG EKG Interpretation  Date/Time:  Wednesday June 08 2018 13:41:15 EDT Ventricular Rate:  76 PR Interval:    QRS Duration: 93 QT Interval:  389 QTC Calculation: 438 R Axis:   40 Text Interpretation:  Sinus rhythm No significant change from prior Confirmed by Alvira MondaySchlossman, Rosia Syme ([id number]) on 06/08/2018 2:58:46 PM   Radiology Dg Chest 2 View  Result Date: 06/08/2018 CLINICAL DATA:  Intermittent chest pain for 2 weeks. EXAM: CHEST - 2 VIEW COMPARISON:  PA and lateral chest 01/24/2018 and 08/26/2017. FINDINGS: The lungs are clear. Heart size is normal. No pneumothorax or pleural fluid. No acute or focal bony abnormality. Vagal nerve stimulator on the left is noted as seen on prior exams. IMPRESSION: Negative chest. Electronically Signed   By: Drusilla Kannerhomas  Dalessio M.D.   On: 06/08/2018 14:54    Procedures Procedures (including critical care time)  Medications Ordered in ED Medications  ketorolac (TORADOL) 30 MG/ML injection 30 mg (30 mg Intravenous Given 06/08/18 1423)  prochlorperazine (COMPAZINE) injection 10 mg (10 mg Intravenous Given 06/08/18 1525)  diphenhydrAMINE (BENADRYL) injection 25 mg (25 mg Intravenous Given 06/08/18 1523)  cyclobenzaprine (FLEXERIL) tablet 10 mg (10 mg Oral Given 06/08/18 1522)     Initial Impression / Assessment and Plan / ED Course  I have reviewed the triage vital signs and the nursing notes.  Pertinent labs & imaging results that were available during my care of the patient were reviewed by me and  considered in my medical decision making (see chart for details).        28yo male with history of vagal nerve stimulator for epilepsy presents with concern for intermittent chest pain over months.  Differential diagnosis for chest pain includes pulmonary embolus, dissection, pneumothorax, pneumonia, ACS, myocarditis, pericarditis.  EKG was done and evaluate by me and showed no acute ST changes and no signs of pericarditis. Chest x-ray was done and evaluated by me and radiology and showed no sign of pneumonia or pneumothorax. Patient is PERC negative and low risk [place]  and have low suspicion for PE.  Has equal bilateral upper and lower extremity pulses, pain is not typical of dissection and duration not consistent with this. Patient is low risk HEART score and has negative troponin with symptoms starting last night.  Given this evaluation, history and physical have low suspicion for pulmonary embolus, pneumonia, ACS, myocarditis, pericarditis, dissection.  Reports headache, no fever, no trauma, doubt acute intracranial bleed or other abnormality, has normal neuro exam. Given toradol, compazine, benadryl and flexeril.    Recommend PCP follow up regarding his concern for BP-discussed given normal and only slightly elevated pressures here in setting of being in ED I do not recommend antihypertensives at this time but feel outpatient follow up is indicated.  Recommend Cardiology follow up regarding palpitations, CP.  No sign of significant arrhythmia in ED.  Has chest wall tenderness, given flexeril for pain, recommend continued outpt follow up. Patient discharged in stable condition with understanding of reasons to return.      Final Clinical Impressions(s) / ED Diagnoses   Final diagnoses:  Chest pain, unspecified type  Chest wall pain  Nonintractable headache, unspecified chronicity pattern, unspecified headache type    ED Discharge Orders         Ordered    ibuprofen (ADVIL) 400 MG tablet   Every 6 hours PRN     06/08/18 1540    cyclobenzaprine (FLEXERIL) 10 MG tablet  2 times daily PRN  06/08/18 1540           Alvira Monday, MD 06/08/18 [phone]

## 2018-06-08 NOTE — ED Notes (Signed)
Pt demanding something to drink, a warm blanket, wants to know when he is leaving, and what his results are. EDP informed and pt's soda and warm blanket given by RN

## 2018-06-08 NOTE — ED Notes (Signed)
ED Provider at bedside. 

## 2018-06-09 LAB — BASIC METABOLIC PANEL

## 2018-07-04 ENCOUNTER — Ambulatory Visit (HOSPITAL_BASED_OUTPATIENT_CLINIC_OR_DEPARTMENT_OTHER)
Admission: RE | Admit: 2018-07-04 | Discharge: 2018-07-04 | Disposition: A | Discharge status: Home / Self Care | Payer: Medicare Other | Source: Ambulatory Visit | Attending: Psychology | Admitting: Psychology

## 2018-07-04 ENCOUNTER — Other Ambulatory Visit: Payer: Self-pay

## 2018-07-04 ENCOUNTER — Other Ambulatory Visit (HOSPITAL_BASED_OUTPATIENT_CLINIC_OR_DEPARTMENT_OTHER): Payer: Self-pay | Admitting: Psychology

## 2018-07-04 DIAGNOSIS — R079 Chest pain, unspecified: Secondary | ICD-10-CM | POA: Insufficient documentation

## 2019-03-04 ENCOUNTER — Encounter (HOSPITAL_BASED_OUTPATIENT_CLINIC_OR_DEPARTMENT_OTHER): Payer: Self-pay | Admitting: Emergency Medicine

## 2019-03-04 ENCOUNTER — Other Ambulatory Visit: Payer: Self-pay

## 2019-03-04 ENCOUNTER — Emergency Department (HOSPITAL_BASED_OUTPATIENT_CLINIC_OR_DEPARTMENT_OTHER)
Admission: EM | Admit: 2019-03-04 | Discharge: 2019-03-04 | Disposition: A | Discharge status: Home / Self Care | Payer: Medicare Other | Attending: Emergency Medicine | Admitting: Emergency Medicine

## 2019-03-04 DIAGNOSIS — M549 Dorsalgia, unspecified: Secondary | ICD-10-CM

## 2019-03-04 DIAGNOSIS — M5489 Other dorsalgia: Secondary | ICD-10-CM | POA: Insufficient documentation

## 2019-03-04 DIAGNOSIS — Z79899 Other long term (current) drug therapy: Secondary | ICD-10-CM | POA: Insufficient documentation

## 2019-03-04 MED ORDER — KETOROLAC TROMETHAMINE 15 MG/ML IJ SOLN
15.0000 mg | Freq: Once | INTRAMUSCULAR | Status: AC
  Administered 2019-03-04: 19:00:00 15 mg via INTRAMUSCULAR
  Filled 2019-03-04: qty 1

## 2019-03-04 MED ORDER — PREDNISONE 10 MG (21) PO TBPK: ORAL_TABLET | Freq: Every day | ORAL | 0 refills | Status: DC

## 2019-03-04 MED ORDER — METHOCARBAMOL 500 MG PO TABS: 500.0000 mg | ORAL_TABLET | Freq: Every evening | ORAL | 0 refills | Status: DC | PRN

## 2019-03-04 NOTE — Discharge Instructions (Signed)
Continue taking home medications as prescribed except for Zanaflex.  Start taking Robaxin instead of Zanaflex. Take prednisone as prescribed.  Follow-up with your neurologist after scheduled appointment next week. Return to the emergency room if you develop fevers, loss of bowel bladder control, persistent numbness, or any new, worsening, or concerning symptoms.

## 2019-03-04 NOTE — ED Provider Notes (Signed)
[facility] EMERGENCY DEPARTMENT Provider Note   CSN: [id number] Arrival date & time: 03/04/19  1713     History Chief Complaint  Patient presents with  . Back Pain    Trevor Baker is a 29 y.o. male presenting for evaluation of back pain.  Patient states the past 9 days, he has been having persistent back pain.  This began after he went to physical therapy and rode his bike home.  Pain is all over his back, from his neck to his lumbar spine.  It is constant, worse when he stands.  He denies fall, trauma, or injury.  He states he has never had similar pain, but then states he has been given multiple rounds of muscle relaxers and pain medicine with his primary care for generalized back pain which was thought to be due to fibromyalgia.  He has a neurologist with whom he has an appointment next week, who is started him on Lyrica.  He denies fevers, chills, nausea, vomiting, abdominal pain, urinary symptoms, loss of bowel bladder control, numbness, tingling, history of cancer, history of IVDU.  He states he has a history of epilepsy, no other medical problems.  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   HPI     Past Medical History:  Diagnosis Date  . Anemia   . Constipation    due to anti seizure  . Head injury    as a child- does not know details  . Lactose intolerance   . Seizures (HCC)    last seizure in April 2014  . Seizures [facility])     Patient Active Problem List   Diagnosis Date Noted  . Closed fracture of distal end of left radius, initial encounter 02/16/2018    Past Surgical History:  Procedure Laterality Date  . MANDIBLE FRACTURE SURGERY    . OPEN REDUCTION INTERNAL FIXATION (ORIF) DISTAL RADIAL FRACTURE Left 02/16/2018   Procedure: OPEN REDUCTION INTERNAL FIXATION (ORIF) DISTAL RADIAL FRACTURE;  Surgeon: Dominica Severin, MD;  Location: [facility] OR;  Service: Orthopedics;  Laterality: Left;  Marland Kitchen VAGUS NERVE STIMULATOR  INSERTION Left    for seizure control , changed x 1  . WISDOM TOOTH EXTRACTION         History reviewed. No pertinent family history.  Social History   Tobacco Use  . Smoking status: Never Smoker  . Smokeless tobacco: Never Used  Substance Use Topics  . Alcohol use: No  . Drug use: No    Home Medications Prior to Admission medications   Medication Sig Start Date End Date Taking? Authorizing Provider  calcium carbonate (CALCIUM 600) 600 MG TABS tablet Take by mouth.    [provider]  cloBAZam (ONFI) 10 MG tablet Take 10 mg by mouth 2 (two) times daily.  11/23/17   [provider]  cyclobenzaprine (FLEXERIL) 10 MG tablet Take 1 tablet (10 mg total) by mouth 2 (two) times daily as needed for muscle spasms. 06/08/18   Alvira Monday, MD  felbamate (FELBATOL) 600 MG tablet Take 600 mg by mouth 3 (three) times daily.    [provider]  fexofenadine (ALLEGRA) 60 MG tablet Take 1 tablet (60 mg total) by mouth 2 (two) times daily. 01/24/18   Dartha Lodge, PA-C  guaiFENesin (MUCINEX) 600 MG 12 hr tablet Take 1 tablet (600 mg total) by mouth 2 (two) times daily. 01/24/18   Dartha Lodge, PA-C  ibuprofen (ADVIL) 400 MG  tablet Take 1 tablet (400 mg total) by mouth every 6 (six) hours as needed. 06/08/18   Gareth Morgan, MD  lacosamide (VIMPAT) 200 MG TABS tablet Take 200 mg by mouth 2 (two) times daily.    [provider]  LORazepam (ATIVAN) 0.5 MG tablet Take 0.5 mg by mouth every 8 (eight) hours.    [provider]  methocarbamol (ROBAXIN) 500 MG tablet Take 1 tablet (500 mg total) by mouth at bedtime as needed for muscle spasms. 03/04/19   Duron Meister, PA-C  phenytoin (PHENYTEK) 300 MG ER capsule Take 300 mg by mouth daily.  08/30/13   [provider]  predniSONE (STERAPRED UNI-PAK 21 TAB) 10 MG (21) TBPK tablet Take by mouth daily. Take 6 tabs by mouth daily  for 2 days, then 5 tabs for 2 days, then 4 tabs for 2 days, then 3 tabs  for 2 days, 2 tabs for 2 days, then 1 tab by mouth daily for 2 days 03/04/19   Kania Regnier, PA-C  primidone (MYSOLINE) 250 MG tablet Take 250 mg by mouth 2 (two) times daily.     [provider]  traZODone (DESYREL) 50 MG tablet Take 50 mg by mouth at bedtime as needed for sleep.    [provider]    Allergies    Citalopram, Penicillins, and Potiga [ezogabine]  Review of Systems   Review of Systems  Musculoskeletal: Positive for back pain.  All other systems reviewed and are negative.   Physical Exam Updated Vital Signs BP (!) 155/90   Pulse 64   Temp 98.7 F (37.1 C) (Oral)   Resp 20   Ht 5\' 11"  (1.803 m)   Wt 97.5 kg   SpO2 100%   BMI 29.99 kg/m   Physical Exam Vitals and nursing note reviewed.  Constitutional:      General: He is not in acute distress.    Appearance: He is well-developed.     Comments: Sitting in the bed in no acute distress  HENT:     Head: Normocephalic and atraumatic.  Eyes:     Extraocular Movements: Extraocular movements intact.     Conjunctiva/sclera: Conjunctivae normal.     Pupils: Pupils are equal, round, and reactive to light.  Neck:     Comments: Full active range of motion of his head without pain Cardiovascular:     Rate and Rhythm: Normal rate and regular rhythm.     Pulses: Normal pulses.  Pulmonary:     Effort: Pulmonary effort is normal. No respiratory distress.     Breath sounds: Normal breath sounds. No wheezing.  Abdominal:     General: There is no distension.     Palpations: Abdomen is soft. There is no mass.     Tenderness: There is no abdominal tenderness. There is no guarding or rebound.  Musculoskeletal:        General: Tenderness present. Normal range of motion.     Cervical back: Normal range of motion and neck supple.     Comments: Generalized tenderness palpation of the entire back.  No focal tenderness.  No midline step-offs or deformities. Strength and sensation intact x4.  No saddle  paresthesias.  Patient is ambulatory without difficulty. Radial and pedal pulses 2+ bilaterally.  Skin:    General: Skin is warm and dry.     Capillary Refill: Capillary refill takes less than 2 seconds.  Neurological:     Mental Status: He is alert and oriented to person, place,  and time.     ED Results / Procedures / Treatments   Labs (all labs ordered are listed, but only abnormal results are displayed) Labs Reviewed - No data to display  EKG None  Radiology No results found.  Procedures Procedures (including critical care time)  Medications Ordered in ED Medications  ketorolac (TORADOL) 15 MG/ML injection 15 mg (15 mg Intramuscular Given 03/04/19 1921)    ED Course  I have reviewed the triage vital signs and the nursing notes.  Pertinent labs & imaging results that were available during my care of the patient were reviewed by me and considered in my medical decision making (see chart for details).    MDM Rules/Calculators/A&P                      Patient presenting for evaluation of generalized back pain.  Physical exam reassuring, neurovascularly intact.  No red flags for back pain.  Pain is reproducible with palpation of the musculature.  Consider MSK pain.  As pain is generalized, also consider fibromyalgia (as PCP thinks). Doubt fracture, I do not believe x-rays will be beneficial.  Doubt vertebral injury, infection, spinal cord compression, myelopathy, or cauda equina syndrome.  Will treat symptomatically with prednisone pack, muscle relaxers, muscle creams.  Patient to follow-up with his neurologist at his scheduled appointment next week.  At this time, patient appears safe for discharge.  Return precautions given.  Patient states he understands and agrees to plan.  Final Clinical Impression(s) / ED Diagnoses Final diagnoses:  Back pain, unspecified back location, unspecified back pain laterality, unspecified chronicity    Rx / DC Orders ED Discharge Orders          Ordered    predniSONE (STERAPRED UNI-PAK 21 TAB) 10 MG (21) TBPK tablet  Daily     03/04/19 1914    methocarbamol (ROBAXIN) 500 MG tablet  At bedtime PRN     03/04/19 1914           Alveria Apley, PA-C 03/04/19 2109    Terrilee Files, MD 03/05/19 [phone]

## 2019-03-04 NOTE — ED Notes (Signed)
ED Provider at bedside. 

## 2019-03-04 NOTE — ED Notes (Signed)
Pt up walking to bathroom. Ambulated without difficulty.

## 2019-03-04 NOTE — ED Triage Notes (Signed)
Pt states that he has had pain to his upper and lower back  Pain for at least a week - the pt went to physical therapy this week and he reports that he has had increased pain from his neck to his lower lumbar region

## 2019-09-22 ENCOUNTER — Other Ambulatory Visit: Payer: Self-pay

## 2019-09-22 ENCOUNTER — Encounter (HOSPITAL_BASED_OUTPATIENT_CLINIC_OR_DEPARTMENT_OTHER): Payer: Self-pay

## 2019-09-22 ENCOUNTER — Emergency Department (HOSPITAL_BASED_OUTPATIENT_CLINIC_OR_DEPARTMENT_OTHER)
Admission: EM | Admit: 2019-09-22 | Discharge: 2019-09-23 | Disposition: A | Discharge status: Home / Self Care | Payer: Medicare Other | Attending: Emergency Medicine | Admitting: Emergency Medicine

## 2019-09-22 DIAGNOSIS — Y9289 Other specified places as the place of occurrence of the external cause: Secondary | ICD-10-CM | POA: Diagnosis not present

## 2019-09-22 DIAGNOSIS — Y998 Other external cause status: Secondary | ICD-10-CM | POA: Insufficient documentation

## 2019-09-22 DIAGNOSIS — R69 Illness, unspecified: Secondary | ICD-10-CM | POA: Diagnosis present

## 2019-09-22 DIAGNOSIS — Y9389 Activity, other specified: Secondary | ICD-10-CM | POA: Diagnosis not present

## 2019-09-22 DIAGNOSIS — S0990XA Unspecified injury of head, initial encounter: Secondary | ICD-10-CM

## 2019-09-22 DIAGNOSIS — W208XXA Other cause of strike by thrown, projected or falling object, initial encounter: Secondary | ICD-10-CM | POA: Insufficient documentation

## 2019-09-22 NOTE — ED Triage Notes (Signed)
Hanging pots and pans tonight at work, pan fell and hit pt on top of head, no swelling/lac noted,  c/o blurry vision and HA

## 2019-09-22 NOTE — ED Notes (Signed)
Pt given a ice pack for his head

## 2019-09-23 ENCOUNTER — Encounter (HOSPITAL_BASED_OUTPATIENT_CLINIC_OR_DEPARTMENT_OTHER): Payer: Self-pay | Admitting: Emergency Medicine

## 2019-09-23 ENCOUNTER — Emergency Department (HOSPITAL_BASED_OUTPATIENT_CLINIC_OR_DEPARTMENT_OTHER): Payer: Medicare Other

## 2019-09-23 DIAGNOSIS — S0990XA Unspecified injury of head, initial encounter: Secondary | ICD-10-CM | POA: Diagnosis not present

## 2019-09-23 MED ORDER — ACETAMINOPHEN 500 MG PO TABS
1000.0000 mg | ORAL_TABLET | Freq: Once | ORAL | Status: AC
  Administered 2019-09-23: 1000 mg via ORAL
  Filled 2019-09-23: qty 2

## 2019-09-23 MED ORDER — IBUPROFEN 800 MG PO TABS: 800.0000 mg | ORAL_TABLET | Freq: Three times a day (TID) | ORAL | 0 refills | Status: DC

## 2019-09-23 NOTE — ED Provider Notes (Signed)
[facility] EMERGENCY DEPARTMENT Provider Note   CSN: [id number] Arrival date & time: 09/22/19  2309     History No chief complaint on file.   Trevor Baker is a 29 y.o. male.  The history is provided by the patient.  Illness Location:  Top of the head Quality:  Pan fell on head at work  Severity:  Moderate Onset quality:  Sudden Timing:  Constant Progression:  Unchanged Chronicity:  New Context:  Was working and pan hit head Relieved by:  Nothing  Worsened by:  Nothing Ineffective treatments:  None tried  Associated symptoms: no abdominal pain, no chest pain, no congestion, no cough, no diarrhea, no ear pain, no fatigue, no fever, no headaches, no loss of consciousness, no myalgias, no nausea, no rash, no rhinorrhea, no shortness of breath, no sore throat, no vomiting and no wheezing   Risk factors:  None      Past Medical History:  Diagnosis Date  . Anemia   . Constipation    due to anti seizure  . Head injury    as a child- does not know details  . Lactose intolerance   . Seizures (HCC)    last seizure in [place] 2014  . Seizures [facility])     Patient Active Problem List   Diagnosis Date Noted  . Closed fracture of distal end of left radius, initial encounter 02/16/2018    Past Surgical History:  Procedure Laterality Date  . MANDIBLE FRACTURE SURGERY    . OPEN REDUCTION INTERNAL FIXATION (ORIF) DISTAL RADIAL FRACTURE Left 02/16/2018   Procedure: OPEN REDUCTION INTERNAL FIXATION (ORIF) DISTAL RADIAL FRACTURE;  Surgeon: Dominica Severin, MD;  Location: [facility] OR;  Service: Orthopedics;  Laterality: Left;  Marland Kitchen VAGUS NERVE STIMULATOR INSERTION Left    for seizure control , changed x 1  . WISDOM TOOTH EXTRACTION         History reviewed. No pertinent family history.  Social History   Tobacco Use  . Smoking status: Never Smoker  . Smokeless tobacco: Never Used  Substance Use Topics  . Alcohol use: No  . Drug use: No    Home Medications Prior to  Admission medications   Medication Sig Start Date End Date Taking? Authorizing Provider  calcium carbonate (CALCIUM 600) 600 MG TABS tablet Take by mouth.    [provider]  cloBAZam (ONFI) 10 MG tablet Take 10 mg by mouth 2 (two) times daily.  11/23/17   [provider]  cyclobenzaprine (FLEXERIL) 10 MG tablet Take 1 tablet (10 mg total) by mouth 2 (two) times daily as needed for muscle spasms. 06/08/18   Alvira Monday, MD  felbamate (FELBATOL) 600 MG tablet Take 600 mg by mouth 3 (three) times daily.    [provider]  fexofenadine (ALLEGRA) 60 MG tablet Take 1 tablet (60 mg total) by mouth 2 (two) times daily. 01/24/18   Dartha Lodge, PA-C  guaiFENesin (MUCINEX) 600 MG 12 hr tablet Take 1 tablet (600 mg total) by mouth 2 (two) times daily. 01/24/18   Dartha Lodge, PA-C  ibuprofen (ADVIL) 400 MG tablet Take 1 tablet (400 mg total) by mouth every 6 (six) hours as needed. 06/08/18   Alvira Monday, MD  ibuprofen (ADVIL) 800 MG tablet Take 1 tablet (800 mg total) by mouth 3 (three) times daily. 09/23/19   Jerin Franzel, MD  lacosamide (VIMPAT) 200 MG TABS tablet Take 200 mg by mouth 2 (two) times daily.    [provider]  LORazepam (ATIVAN) 0.5 MG tablet Take 0.5 mg by mouth every 8 (eight) hours.    [provider]  methocarbamol (ROBAXIN) 500 MG tablet Take 1 tablet (500 mg total) by mouth at bedtime as needed for muscle spasms. 03/04/19   Caccavale, Sophia, PA-C  phenytoin (PHENYTEK) 300 MG ER capsule Take 300 mg by mouth daily.  08/30/13   [provider]  predniSONE (STERAPRED UNI-PAK 21 TAB) 10 MG (21) TBPK tablet Take by mouth daily. Take 6 tabs by mouth daily  for 2 days, then 5 tabs for 2 days, then 4 tabs for 2 days, then 3 tabs for 2 days, 2 tabs for 2 days, then 1 tab by mouth daily for 2 days 03/04/19   Caccavale, Sophia, PA-C  primidone (MYSOLINE) 250 MG tablet Take 250 mg by mouth 2 (two) times daily.     [provider]   traZODone (DESYREL) 50 MG tablet Take 50 mg by mouth at bedtime as needed for sleep.    [provider]    Allergies    Citalopram, Penicillins, and Potiga [ezogabine]  Review of Systems   Review of Systems  Constitutional: Negative for fatigue and fever.  HENT: Negative for congestion, ear pain, rhinorrhea and sore throat.   Eyes: Negative for visual disturbance.  Respiratory: Negative for cough, shortness of breath and wheezing.   Cardiovascular: Negative for chest pain.  Gastrointestinal: Negative for abdominal pain, diarrhea, nausea and vomiting.  Genitourinary: Negative for difficulty urinating.  Musculoskeletal: Negative for myalgias.  Skin: Negative for rash.  Neurological: Negative for dizziness, seizures, loss of consciousness, facial asymmetry, speech difficulty, numbness and headaches.  Psychiatric/Behavioral: Negative for agitation.  All other systems reviewed and are negative.   Physical Exam Updated Vital Signs BP 123/77 (BP Location: Left Arm)   Pulse 93   Temp 98.2 F (36.8 C)   Ht 5\' 9"  (1.753 m)   Wt 96.6 kg   SpO2 100%   BMI 31.45 kg/m   Physical Exam Vitals and nursing note reviewed.  Constitutional:      General: He is not in acute distress.    Appearance: Normal appearance.  HENT:     Head: Normocephalic and atraumatic.     Nose: Nose normal.     Mouth/Throat:     Mouth: Mucous membranes are moist.     Pharynx: Oropharynx is clear.  Eyes:     Conjunctiva/sclera: Conjunctivae normal.     Pupils: Pupils are equal, round, and reactive to light.  Cardiovascular:     Rate and Rhythm: Normal rate and regular rhythm.     Pulses: Normal pulses.     Heart sounds: Normal heart sounds.  Pulmonary:     Effort: Pulmonary effort is normal.     Breath sounds: Normal breath sounds.  Abdominal:     General: Abdomen is flat. Bowel sounds are normal.     Palpations: Abdomen is soft.     Tenderness: There is no abdominal tenderness. There is no  guarding.  Musculoskeletal:        General: Normal range of motion.     Cervical back: Normal range of motion and neck supple.  Skin:    General: Skin is warm and dry.     Capillary Refill: Capillary refill takes less than 2 seconds.  Neurological:     General: No focal deficit present.     Mental Status: He is alert and oriented to person, place, and time.     Deep Tendon  Reflexes: Reflexes normal.  Psychiatric:        Mood and Affect: Mood normal.        Behavior: Behavior normal.     ED Results / Procedures / Treatments   Labs (all labs ordered are listed, but only abnormal results are displayed) Labs Reviewed - No data to display  EKG None  Radiology CT Head Wo Contrast  Result Date: 09/23/2019 CLINICAL DATA:  Head trauma, minor EXAM: CT HEAD WITHOUT CONTRAST TECHNIQUE: Contiguous axial images were obtained from the base of the skull through the vertex without intravenous contrast. COMPARISON:  None. FINDINGS: Brain: There is no mass, hemorrhage or extra-axial collection. The size and configuration of the ventricles and extra-axial CSF spaces are normal. The brain parenchyma is normal, without acute or chronic infarction. Vascular: No abnormal hyperdensity of the major intracranial arteries or dural venous sinuses. No intracranial atherosclerosis. Skull: The visualized skull base, calvarium and extracranial soft tissues are normal. Sinuses/Orbits: No fluid levels or advanced mucosal thickening of the visualized paranasal sinuses. No mastoid or middle ear effusion. The orbits are normal. IMPRESSION: Normal head CT. Electronically Signed   By: Deatra Robinson M.D.   On: 09/23/2019 00:37    Procedures Procedures (including critical care time)  Medications Ordered in ED Medications  acetaminophen (TYLENOL) tablet 1,000 mg (1,000 mg Oral Given 09/23/19 0033)    ED Course  I have reviewed the triage vital signs and the nursing notes.  Pertinent labs & imaging results that were  available during my care of the patient were reviewed by me and considered in my medical decision making (see chart for details).    Minor head injury, brain is healthy.  Alternate tylenol and ibuprofen. Limit screen time on all devices to < 30 minutes.  No contact sports.  Hydrate with liquids.    Trevor Baker was evaluated in Emergency Department on 09/23/2019 for the symptoms described in the history of present illness. He was evaluated in the context of the global COVID-19 pandemic, which necessitated consideration that the patient might be at risk for infection with the SARS-CoV-2 virus that causes COVID-19. Institutional protocols and algorithms that pertain to the evaluation of patients at risk for COVID-19 are in a state of rapid change based on information released by regulatory bodies including the CDC and federal and state organizations. These policies and algorithms were followed during the patient's care in the ED.  Final Clinical Impression(s) / ED Diagnoses Final diagnoses:  Minor head injury, initial encounter   Return for intractable cough, coughing up blood,fevers >100.4 unrelieved by medication, shortness of breath, intractable vomiting, chest pain, shortness of breath, weakness,numbness, changes in speech, facial asymmetry,abdominal pain, passing out,Inability to tolerate liquids or food, cough, altered mental status or any concerns. No signs of systemic illness or infection. The patient is nontoxic-appearing on exam and vital signs are within normal limits.   I have reviewed the triage vital signs and the nursing notes. Pertinent labs &imaging results that were available during my care of the patient were reviewed by me and considered in my medical decision making (see chart for details).After history, exam, and medical workup I feel the patient has beenappropriately medically screened and is safe for discharge home. Pertinent diagnoses were discussed with the patient.  Patient was given return precautions.    Rx / DC Orders ED Discharge Orders         Ordered    ibuprofen (ADVIL) 800 MG tablet  3 times daily  Discontinue  Reprint     09/23/19 0102           Breylan Lefevers, MD 09/23/19 [id number]

## 2019-10-04 ENCOUNTER — Encounter (HOSPITAL_BASED_OUTPATIENT_CLINIC_OR_DEPARTMENT_OTHER): Payer: Self-pay | Admitting: Emergency Medicine

## 2019-10-04 ENCOUNTER — Other Ambulatory Visit: Payer: Self-pay

## 2019-10-04 ENCOUNTER — Emergency Department (HOSPITAL_BASED_OUTPATIENT_CLINIC_OR_DEPARTMENT_OTHER)
Admission: EM | Admit: 2019-10-04 | Discharge: 2019-10-04 | Disposition: A | Discharge status: Home / Self Care | Payer: Medicare Other | Attending: Emergency Medicine | Admitting: Emergency Medicine

## 2019-10-04 DIAGNOSIS — M25512 Pain in left shoulder: Secondary | ICD-10-CM | POA: Diagnosis present

## 2019-10-04 MED ORDER — DEXAMETHASONE 4 MG PO TABS: 4.0000 mg | ORAL_TABLET | Freq: Two times a day (BID) | ORAL | 0 refills | Status: DC

## 2019-10-04 MED ORDER — NAPROXEN 375 MG PO TABS: 375.0000 mg | ORAL_TABLET | Freq: Two times a day (BID) | ORAL | 0 refills | Status: DC

## 2019-10-04 NOTE — ED Triage Notes (Signed)
Pt brought in by EMS this morning from home with c/o left shoulder pain   Pt states the pain woke him up out of his sleep   Denies injury  States his shoulder was fine last night   Pt states when he got up his right leg gave out on him and he fell

## 2019-10-04 NOTE — ED Notes (Signed)
Pt discharged to home. Discharge instructions have been discussed with patient and/or family members. Pt verbally acknowledges understanding d/c instructions, and endorses comprehension to checkout at registration before leaving.  °

## 2019-10-11 NOTE — ED Provider Notes (Signed)
[facility] EMERGENCY DEPARTMENT Provider Note   CSN: [id number] Arrival date & time: 10/04/19  5361     History Chief Complaint  Patient presents with  . Shoulder Pain    Trevor Baker is a 29 y.o. male.  HPI   29 year old male with left shoulder pain.  Woke up this morning with the pain.  Went to sleep in his usual state of health last night.  Pain is constant.  Worse in.  Radiates down into his arm.  Denies any neck pain.  Past Medical History:  Diagnosis Date  . Anemia   . Constipation    due to anti seizure  . Head injury    as a child- does not know details  . Lactose intolerance   . Seizures (HCC)    last seizure in April 2014  . Seizures [facility])     Patient Active Problem List   Diagnosis Date Noted  . Closed fracture of distal end of left radius, initial encounter 02/16/2018    Past Surgical History:  Procedure Laterality Date  . MANDIBLE FRACTURE SURGERY    . OPEN REDUCTION INTERNAL FIXATION (ORIF) DISTAL RADIAL FRACTURE Left 02/16/2018   Procedure: OPEN REDUCTION INTERNAL FIXATION (ORIF) DISTAL RADIAL FRACTURE;  Surgeon: Dominica Severin, MD;  Location: [facility] OR;  Service: Orthopedics;  Laterality: Left;  Marland Kitchen VAGUS NERVE STIMULATOR INSERTION Left    for seizure control , changed x 1  . WISDOM TOOTH EXTRACTION         Family History  Problem Relation Age of Onset  . Diabetes Mother   . Hypertension Mother   . Hyperlipidemia Mother   . Cancer Father     Social History   Tobacco Use  . Smoking status: Never Smoker  . Smokeless tobacco: Never Used  Vaping Use  . Vaping Use: Never used  Substance Use Topics  . Alcohol use: No  . Drug use: No    Home Medications Prior to Admission medications   Medication Sig Start Date End Date Taking? Authorizing Provider  calcium carbonate (CALCIUM 600) 600 MG TABS tablet Take by mouth.    [provider]  cloBAZam (ONFI) 10 MG tablet Take 10 mg by mouth 2 (two) times daily.  11/23/17    [provider]  cyclobenzaprine (FLEXERIL) 10 MG tablet Take 1 tablet (10 mg total) by mouth 2 (two) times daily as needed for muscle spasms. 06/08/18   Alvira Monday, MD  dexamethasone (DECADRON) 4 MG tablet Take 1 tablet (4 mg total) by mouth 2 (two) times daily. 10/04/19   Raeford Razor, MD  felbamate (FELBATOL) 600 MG tablet Take 600 mg by mouth 3 (three) times daily.    [provider]  fexofenadine (ALLEGRA) 60 MG tablet Take 1 tablet (60 mg total) by mouth 2 (two) times daily. 01/24/18   Dartha Lodge, PA-C  guaiFENesin (MUCINEX) 600 MG 12 hr tablet Take 1 tablet (600 mg total) by mouth 2 (two) times daily. 01/24/18   Dartha Lodge, PA-C  ibuprofen (ADVIL) 400 MG tablet Take 1 tablet (400 mg total) by mouth every 6 (six) hours as needed. 06/08/18   Alvira Monday, MD  ibuprofen (ADVIL) 800 MG tablet Take 1 tablet (800 mg total) by mouth 3 (three) times daily. 09/23/19   Palumbo, April, MD  lacosamide (VIMPAT) 200 MG TABS tablet Take 200 mg by mouth 2 (two) times daily.    [provider]  LORazepam (ATIVAN) 0.5 MG tablet Take 0.5 mg by mouth  every 8 (eight) hours.    [provider]  methocarbamol (ROBAXIN) 500 MG tablet Take 1 tablet (500 mg total) by mouth at bedtime as needed for muscle spasms. 03/04/19   Caccavale, Sophia, PA-C  naproxen (NAPROSYN) 375 MG tablet Take 1 tablet (375 mg total) by mouth 2 (two) times daily with a meal. 10/04/19   Raeford Razor, MD  phenytoin (PHENYTEK) 300 MG ER capsule Take 300 mg by mouth daily.  08/30/13   [provider]  predniSONE (STERAPRED UNI-PAK 21 TAB) 10 MG (21) TBPK tablet Take by mouth daily. Take 6 tabs by mouth daily  for 2 days, then 5 tabs for 2 days, then 4 tabs for 2 days, then 3 tabs for 2 days, 2 tabs for 2 days, then 1 tab by mouth daily for 2 days 03/04/19   Caccavale, Sophia, PA-C  primidone (MYSOLINE) 250 MG tablet Take 250 mg by mouth 2 (two) times daily.     [provider]   traZODone (DESYREL) 50 MG tablet Take 50 mg by mouth at bedtime as needed for sleep.    [provider]    Allergies    Citalopram, Penicillins, and Potiga [ezogabine]  Review of Systems   Review of Systems All systems reviewed and negative, other than as noted in HPI.  Physical Exam Updated Vital Signs BP 131/76 (BP Location: Right Arm)   Pulse 74   Temp 97.7 F (36.5 C) (Oral)   Resp 18   Ht 5\' 9"  (1.753 m)   SpO2 98%   BMI 31.45 kg/m   Physical Exam Vitals and nursing note reviewed.  Constitutional:      General: He is not in acute distress.    Appearance: He is well-developed.  HENT:     Head: Normocephalic and atraumatic.  Eyes:     General:        Right eye: No discharge.        Left eye: No discharge.     Conjunctiva/sclera: Conjunctivae normal.  Cardiovascular:     Rate and Rhythm: Normal rate and regular rhythm.     Heart sounds: Normal heart sounds. No murmur heard.  No friction rub. No gallop.   Pulmonary:     Effort: Pulmonary effort is normal. No respiratory distress.     Breath sounds: Normal breath sounds.  Abdominal:     General: There is no distension.     Palpations: Abdomen is soft.     Tenderness: There is no abdominal tenderness.  Musculoskeletal:     Cervical back: Neck supple.     Comments: No midline spinal tenderness.  Mild tenderness to palpation over the anterior left shoulder musculature.  Some pain with range of motion but he can actively range the shoulder.  Neurovascular intact.  Skin:    General: Skin is warm and dry.  Neurological:     Mental Status: He is alert.  Psychiatric:        Behavior: Behavior normal.        Thought Content: Thought content normal.     ED Results / Procedures / Treatments   Labs (all labs ordered are listed, but only abnormal results are displayed) Labs Reviewed - No data to display  EKG None  Radiology No results found.  Procedures Procedures (including critical care  time)  Medications Ordered in ED Medications - No data to display  ED Course  I have reviewed the triage vital signs and the nursing notes.  Pertinent labs &  imaging results that were available during my care of the patient were reviewed by me and considered in my medical decision making (see chart for details).    MDM Rules/Calculators/A&P                          29 year old male with left shoulder pain.  No trauma.  Neurovascular intact.  Plan symptomatic treatment.  Activity as tolerated.  Return precautions discussed.  Outpatient follow-up otherwise.  Final Clinical Impression(s) / ED Diagnoses Final diagnoses:  Acute pain of left shoulder    Rx / DC Orders ED Discharge Orders         Ordered    dexamethasone (DECADRON) 4 MG tablet  2 times daily        10/04/19 0906    naproxen (NAPROSYN) 375 MG tablet  2 times daily with meals        10/04/19 0906           Raeford Razor, MD 10/11/19 [phone]

## 2019-10-26 ENCOUNTER — Encounter (HOSPITAL_BASED_OUTPATIENT_CLINIC_OR_DEPARTMENT_OTHER): Payer: Self-pay | Admitting: Emergency Medicine

## 2019-10-26 ENCOUNTER — Other Ambulatory Visit: Payer: Self-pay

## 2019-10-26 DIAGNOSIS — R21 Rash and other nonspecific skin eruption: Secondary | ICD-10-CM | POA: Insufficient documentation

## 2019-10-26 DIAGNOSIS — R531 Weakness: Secondary | ICD-10-CM | POA: Diagnosis not present

## 2019-10-26 DIAGNOSIS — Z79899 Other long term (current) drug therapy: Secondary | ICD-10-CM | POA: Diagnosis not present

## 2019-10-26 DIAGNOSIS — G5603 Carpal tunnel syndrome, bilateral upper limbs: Secondary | ICD-10-CM | POA: Insufficient documentation

## 2019-10-26 NOTE — ED Triage Notes (Signed)
Pt arrives with complaints of ongoing eczema to bilateral feet. Has been reports using creams given by health dept and has had no relief. Also reports bilateral wrist pain, headaches, fatigue, sleeping too long.

## 2019-10-27 ENCOUNTER — Emergency Department (HOSPITAL_BASED_OUTPATIENT_CLINIC_OR_DEPARTMENT_OTHER)
Admission: EM | Admit: 2019-10-27 | Discharge: 2019-10-27 | Disposition: A | Discharge status: Home / Self Care | Payer: Medicare Other | Attending: Emergency Medicine | Admitting: Emergency Medicine

## 2019-10-27 DIAGNOSIS — R21 Rash and other nonspecific skin eruption: Secondary | ICD-10-CM | POA: Diagnosis not present

## 2019-10-27 DIAGNOSIS — R531 Weakness: Secondary | ICD-10-CM

## 2019-10-27 DIAGNOSIS — G5603 Carpal tunnel syndrome, bilateral upper limbs: Secondary | ICD-10-CM

## 2019-10-27 LAB — CBC WITH DIFFERENTIAL/PLATELET
Abs Immature Granulocytes: 0 10*3/uL (ref 0.00–0.07)
Basophils Absolute: 0 10*3/uL (ref 0.0–0.1)
Basophils Relative: 0 %
Eosinophils Absolute: 0.2 10*3/uL (ref 0.0–0.5)
Eosinophils Relative: 5 %
HCT: 39.6 % (ref 39.0–52.0)
Hemoglobin: 13.5 g/dL (ref 13.0–17.0)
Immature Granulocytes: 0 %
Lymphocytes Relative: 57 %
Lymphs Abs: 2.1 10*3/uL (ref 0.7–4.0)
MCH: 33 pg (ref 26.0–34.0)
MCHC: 34.1 g/dL (ref 30.0–36.0)
MCV: 96.8 fL (ref 80.0–100.0)
Monocytes Absolute: 0.3 10*3/uL (ref 0.1–1.0)
Monocytes Relative: 7 %
Neutro Abs: 1.1 10*3/uL — ABNORMAL LOW (ref 1.7–7.7)
Neutrophils Relative %: 31 %
Platelets: 219 10*3/uL (ref 150–400)
RBC: 4.09 MIL/uL — ABNORMAL LOW (ref 4.22–5.81)
RDW: 12.5 % (ref 11.5–15.5)
WBC: 3.6 10*3/uL — ABNORMAL LOW (ref 4.0–10.5)
nRBC: 0 % (ref 0.0–0.2)

## 2019-10-27 LAB — BASIC METABOLIC PANEL
Anion gap: 9 (ref 5–15)
BUN: 9 mg/dL (ref 6–20)
CO2: 27 mmol/L (ref 22–32)
Calcium: 8.6 mg/dL — ABNORMAL LOW (ref 8.9–10.3)
Chloride: 104 mmol/L (ref 98–111)
Creatinine, Ser: 0.75 mg/dL (ref 0.61–1.24)
GFR calc Af Amer: >60 mL/min (ref >60)
GFR calc non Af Amer: >60 mL/min (ref >60)
Glucose, Bld: 122 mg/dL — ABNORMAL HIGH (ref 70–99)
Potassium: 3.6 mmol/L (ref 3.5–5.1)
Sodium: 140 mmol/L (ref 135–145)

## 2019-10-27 MED ORDER — CLOTRIMAZOLE-BETAMETHASONE 1-0.05 % EX CREA: TOPICAL_CREAM | CUTANEOUS | 0 refills | Status: AC

## 2019-10-27 NOTE — ED Provider Notes (Signed)
[facility] Provider Note: Lowella Dell, MD, FACEP  CSN: [id number] MRN: [id number] ARRIVAL: 10/26/19 at 2259 ROOM: [facility]07/[facility]07   CHIEF COMPLAINT  Eczema and Fatigue   HISTORY OF PRESENT ILLNESS  10/27/19 2:41 AM Trevor Baker is a 29 y.o. male with about 6 weeks of a rash to his feet.  The rash is primarily on the dorsal aspects of his feet.  The rash is scaly in nature.  He was seen by dermatology and prescribed Temovate cream for "eczema" which he states has only made it worse.  It does not itch but has started bleeding.  He does not believe it is eczema.  He has also been placed on Strattera for ADHD.  He states it worked at first but since his dose was increased he is felt extremely fatigued, has had headaches, and has been caught somnolent at work and at home.  He feels like he lacks energy.  He has had to increase caffeine intake in an attempt to counteract these effects.  He has an appointment with his prescribing psychiatrist this morning at 8 AM.  He also complains of pain and numbness in his first second and third fingers bilaterally.  He is in a wrist splint on the right which he states helps his symptoms.  He rates the pain as a 10 out of 10 at its worst although he denies pain or numbness at the present time.  He states he does not believe his symptoms are due to carpal tunnel syndrome but rather to a pinched nerve in his neck and he is requesting an MRI this morning.   Past Medical History:  Diagnosis Date   Anemia    Constipation    due to anti seizure   Head injury    as a child- does not know details   Lactose intolerance    Seizures (HCC)    last seizure in April 2014   Seizures [facility])     Past Surgical History:  Procedure Laterality Date   MANDIBLE FRACTURE SURGERY     OPEN REDUCTION INTERNAL FIXATION (ORIF) DISTAL RADIAL FRACTURE Left 02/16/2018   Procedure: OPEN REDUCTION INTERNAL FIXATION (ORIF) DISTAL RADIAL FRACTURE;  Surgeon: Dominica Severin, MD;  Location: [facility] OR;  Service: Orthopedics;  Laterality: Left;   VAGUS NERVE STIMULATOR INSERTION Left    for seizure control , changed x 1   WISDOM TOOTH EXTRACTION      Family History  Problem Relation Age of Onset   Diabetes Mother    Hypertension Mother    Hyperlipidemia Mother    Cancer Father     Social History   Tobacco Use   Smoking status: Never Smoker   Smokeless tobacco: Never Used  Vaping Use   Vaping Use: Never used  Substance Use Topics   Alcohol use: No   Drug use: No    Prior to Admission medications   Medication Sig Start Date End Date Taking? Authorizing Provider  atomoxetine (STRATTERA) 25 MG capsule Take one capsule daily for one week then take 2 capsules daily 10/10/19  Yes [provider]  pregabalin (LYRICA) 150 MG capsule  10/11/19  Yes [provider]  promethazine (PHENERGAN) 25 MG tablet  09/15/19  Yes [provider]  butalbital-acetaminophen-caffeine (FIORICET) 50-325-40 MG tablet Take 1 tablet by mouth every 6 (six) hours as needed. 10/13/19   [provider]  calcium carbonate (CALCIUM 600) 600 MG TABS tablet Take by mouth.    [provider]  cloBAZam (ONFI) 10 MG tablet Take 10 mg by mouth 2 (two) times daily.  11/23/17   [provider]  clotrimazole-betamethasone (LOTRISONE) cream Apply to affected area 2 times daily. 10/27/19   Kavitha Lansdale, MD  felbamate (FELBATOL) 600 MG tablet Take 600 mg by mouth 3 (three) times daily.    [provider]  ferrous sulfate 325 (65 FE) MG tablet Take by mouth.    [provider]  lacosamide (VIMPAT) 200 MG TABS tablet Take 200 mg by mouth 2 (two) times daily.    [provider]  LORazepam (ATIVAN) 0.5 MG tablet Take 0.5 mg by mouth every 8 (eight) hours.    [provider]  omeprazole (PRILOSEC) 40 MG capsule Take by mouth.    [provider]  phenytoin (PHENYTEK) 300 MG ER capsule Take 300 mg  by mouth daily.  08/30/13   [provider]  primidone (MYSOLINE) 250 MG tablet Take 250 mg by mouth 2 (two) times daily.     [provider]  tiZANidine (ZANAFLEX) 4 MG tablet Take 4 mg by mouth every 8 (eight) hours as needed. 10/13/19   [provider]  traZODone (DESYREL) 50 MG tablet Take 50 mg by mouth at bedtime as needed for sleep.    [provider]  fexofenadine (ALLEGRA) 60 MG tablet Take 1 tablet (60 mg total) by mouth 2 (two) times daily. 01/24/18 10/27/19  Dartha Lodge, PA-C    Allergies Citalopram, Penicillins, and Potiga [ezogabine]   REVIEW OF SYSTEMS  Negative except as noted here or in the History of Present Illness.   PHYSICAL EXAMINATION  Initial Vital Signs Blood pressure 138/78, pulse 76, temperature 97.8 F (36.6 C), temperature source Oral, resp. rate 14, SpO2 100 %.  Examination General: Well-developed, well-nourished male in no acute distress; appearance consistent with age of record HENT: normocephalic; atraumatic Eyes: Normal appearance Neck: supple Heart: regular rate and rhythm Lungs: clear to auscultation bilaterally Abdomen: soft; nondistended; nontender;  bowel sounds present Extremities: No deformity; full range of motion; pulses normal; sensation intact in median nerve distribution bilaterally; right wrist in Velcro splint Neurologic: Awake, alert and oriented; motor function intact in all extremities and symmetric; no facial droop Skin: Warm and dry; scaly rash of dorsal feet:      Psychiatric: Flat affect   RESULTS  Summary of this visit's results, reviewed and interpreted by myself:   EKG Interpretation  Date/Time:    Ventricular Rate:    PR Interval:    QRS Duration:   QT Interval:    QTC Calculation:   R Axis:     Text Interpretation:        Laboratory Studies: Results for orders placed or performed during the hospital encounter of 10/27/19 (from the past 24 hour(s))  CBC with  Differential/Platelet     Status: Abnormal   Collection Time: 10/27/19  2:56 AM  Result Value Ref Range   WBC 3.6 (L) 4.0 - 10.5 K/uL   RBC 4.09 (L) 4.22 - 5.81 MIL/uL   Hemoglobin 13.5 13.0 - 17.0 g/dL   HCT 16.1 39 - 52 %   MCV 96.8 80.0 - 100.0 fL   MCH 33.0 26.0 - 34.0 pg   MCHC 34.1 30.0 - 36.0 g/dL   RDW 09.6 04.5 - 40.9 %   Platelets 219 150 - 400 K/uL   nRBC 0.0 0.0 - 0.2 %   Neutrophils Relative % 31 %   Neutro Abs 1.1 (L) 1.7 - 7.7  K/uL   Lymphocytes Relative 57 %   Lymphs Abs 2.1 0.7 - 4.0 K/uL   Monocytes Relative 7 %   Monocytes Absolute 0.3 0 - 1 K/uL   Eosinophils Relative 5 %   Eosinophils Absolute 0.2 0 - 0 K/uL   Basophils Relative 0 %   Basophils Absolute 0.0 0 - 0 K/uL   Immature Granulocytes 0 %   Abs Immature Granulocytes 0.00 0.00 - 0.07 K/uL  Basic metabolic panel     Status: Abnormal   Collection Time: 10/27/19  2:56 AM  Result Value Ref Range   Sodium 140 135 - 145 mmol/L   Potassium 3.6 3.5 - 5.1 mmol/L   Chloride 104 98 - 111 mmol/L   CO2 27 22 - 32 mmol/L   Glucose, Bld 122 (H) 70 - 99 mg/dL   BUN 9 6 - 20 mg/dL   Creatinine, Ser 9.48 0.61 - 1.24 mg/dL   Calcium 8.6 (L) 8.9 - 10.3 mg/dL   GFR calc non Af Amer >60 >60 mL/min   GFR calc Af Amer >60 >60 mL/min   Anion gap 9 5 - 15   Imaging Studies: No results found.  ED COURSE and MDM  Nursing notes, initial and subsequent vitals signs, including pulse oximetry, reviewed and interpreted by myself.  Vitals:   10/26/19 2329 10/27/19 0145  BP: (!) 127/94 138/78  Pulse: 74 76  Resp: 14 14  Temp: 98.7 F (37.1 C) 97.8 F (36.6 C)  TempSrc: Oral Oral  SpO2: 100% 100%   Medications - No data to display  3:22 AM The patient states his feet are exposed to water at work and he does not have waterproof shoes or boots to wear.  He is concerned he may have athlete's foot.  We will switch to Lotrisone cream and advised him to follow-up with his dermatologist.  As for his hand pain and numbness  this is consistent with carpal tunnel syndrome.  I do not believe this represents cervical radiculopathy.  He was advised we do not do neck MRIs in the ED and he should follow-up with his neurologist as his daughter neurologist has previously recommended nerve conduction studies.  He should also follow-up with his psychiatrist later this morning as scheduled as he is concerned about possible side effects from Strattera.  PROCEDURES  Procedures   ED DIAGNOSES     ICD-10-CM   1. Rash of both feet  R21   2. Generalized weakness  R53.1   3. Bilateral carpal tunnel syndrome  G56.03        Lavenia Stumpo, Jonny Ruiz, MD 10/27/19 [phone]

## 2020-04-14 ENCOUNTER — Encounter (HOSPITAL_BASED_OUTPATIENT_CLINIC_OR_DEPARTMENT_OTHER): Payer: Self-pay | Admitting: Emergency Medicine

## 2020-04-14 ENCOUNTER — Emergency Department (HOSPITAL_BASED_OUTPATIENT_CLINIC_OR_DEPARTMENT_OTHER): Payer: Medicare Other

## 2020-04-14 ENCOUNTER — Emergency Department (HOSPITAL_BASED_OUTPATIENT_CLINIC_OR_DEPARTMENT_OTHER)
Admission: EM | Admit: 2020-04-14 | Discharge: 2020-04-14 | Disposition: A | Discharge status: Home / Self Care | Payer: Medicare Other | Attending: Emergency Medicine | Admitting: Emergency Medicine

## 2020-04-14 DIAGNOSIS — M79676 Pain in unspecified toe(s): Secondary | ICD-10-CM | POA: Diagnosis not present

## 2020-04-14 DIAGNOSIS — M79675 Pain in left toe(s): Secondary | ICD-10-CM

## 2020-04-14 DIAGNOSIS — L929 Granulomatous disorder of the skin and subcutaneous tissue, unspecified: Secondary | ICD-10-CM | POA: Insufficient documentation

## 2020-04-14 DIAGNOSIS — R519 Headache, unspecified: Secondary | ICD-10-CM | POA: Diagnosis present

## 2020-04-14 DIAGNOSIS — H5713 Ocular pain, bilateral: Secondary | ICD-10-CM | POA: Insufficient documentation

## 2020-04-14 LAB — BASIC METABOLIC PANEL
Anion gap: 10 (ref 5–15)
BUN: <5 mg/dL — ABNORMAL LOW (ref 6–20)
CO2: 24 mmol/L (ref 22–32)
Calcium: 8.3 mg/dL — ABNORMAL LOW (ref 8.9–10.3)
Chloride: 104 mmol/L (ref 98–111)
Creatinine, Ser: 0.73 mg/dL (ref 0.61–1.24)
GFR, Estimated: >60 mL/min (ref >60)
Glucose, Bld: 106 mg/dL — ABNORMAL HIGH (ref 70–99)
Potassium: 3.2 mmol/L — ABNORMAL LOW (ref 3.5–5.1)
Sodium: 138 mmol/L (ref 135–145)

## 2020-04-14 LAB — CBC WITH DIFFERENTIAL/PLATELET
Abs Immature Granulocytes: 0 10*3/uL (ref 0.00–0.07)
Basophils Absolute: 0 10*3/uL (ref 0.0–0.1)
Basophils Relative: 0 %
Eosinophils Absolute: 0.2 10*3/uL (ref 0.0–0.5)
Eosinophils Relative: 5 %
HCT: 36.7 % — ABNORMAL LOW (ref 39.0–52.0)
Hemoglobin: 12.4 g/dL — ABNORMAL LOW (ref 13.0–17.0)
Immature Granulocytes: 0 %
Lymphocytes Relative: 49 %
Lymphs Abs: 2.1 10*3/uL (ref 0.7–4.0)
MCH: 32.8 pg (ref 26.0–34.0)
MCHC: 33.8 g/dL (ref 30.0–36.0)
MCV: 97.1 fL (ref 80.0–100.0)
Monocytes Absolute: 0.3 10*3/uL (ref 0.1–1.0)
Monocytes Relative: 8 %
Neutro Abs: 1.6 10*3/uL — ABNORMAL LOW (ref 1.7–7.7)
Neutrophils Relative %: 38 %
Platelets: 187 10*3/uL (ref 150–400)
RBC: 3.78 MIL/uL — ABNORMAL LOW (ref 4.22–5.81)
RDW: 12.6 % (ref 11.5–15.5)
WBC: 4.2 10*3/uL (ref 4.0–10.5)
nRBC: 0 % (ref 0.0–0.2)

## 2020-04-14 LAB — PHENYTOIN LEVEL, TOTAL: Phenytoin Lvl: 13.7 ug/mL (ref 10.0–20.0)

## 2020-04-14 MED ORDER — CEPHALEXIN 500 MG PO CAPS: 500.0000 mg | ORAL_CAPSULE | Freq: Four times a day (QID) | ORAL | 0 refills | Status: DC

## 2020-04-14 MED ORDER — CEPHALEXIN 250 MG PO CAPS
500.0000 mg | ORAL_CAPSULE | Freq: Once | ORAL | Status: AC
  Administered 2020-04-14: 500 mg via ORAL
  Filled 2020-04-14: qty 2

## 2020-04-14 MED ORDER — TRAMADOL HCL 50 MG PO TABS: 50.0000 mg | ORAL_TABLET | Freq: Four times a day (QID) | ORAL | 0 refills | Status: AC | PRN

## 2020-04-14 MED ORDER — HYDROCODONE-ACETAMINOPHEN 5-325 MG PO TABS
2.0000 | ORAL_TABLET | Freq: Once | ORAL | Status: AC
Start: 2020-04-14 — End: 2020-04-14
  Administered 2020-04-14: 2 via ORAL
  Filled 2020-04-14: qty 2

## 2020-04-14 MED ORDER — CEPHALEXIN 500 MG PO CAPS: 500.0000 mg | ORAL_CAPSULE | Freq: Four times a day (QID) | ORAL | 0 refills | Status: AC

## 2020-04-14 MED ORDER — TRAMADOL HCL 50 MG PO TABS
50.0000 mg | ORAL_TABLET | Freq: Four times a day (QID) | ORAL | 0 refills | Status: DC | PRN
Start: 2020-04-14 — End: 2020-04-14

## 2020-04-14 MED ORDER — KETOROLAC TROMETHAMINE 60 MG/2ML IM SOLN
INTRAMUSCULAR | Status: AC
  Administered 2020-04-14: 60 mg via INTRAMUSCULAR
  Filled 2020-04-14: qty 2

## 2020-04-14 MED ORDER — KETOROLAC TROMETHAMINE 60 MG/2ML IM SOLN: 60.0000 mg | Freq: Once | INTRAMUSCULAR | Status: AC

## 2020-04-14 NOTE — ED Provider Notes (Signed)
[facility] EMERGENCY DEPARTMENT Provider Note   CSN: [id number] Arrival date & time: 04/14/20  0032     History Chief Complaint  Patient presents with  . Headache    Trevor Baker is a 30 y.o. male.  Patient is a 30 year old male with history of seizure disorder, optic nerve inflammation, ADHD, anxiety, and unspecified neuropathy.  Patient presenting today with complaints of headache and bilateral eye irritation.  He has seen a neurologist in the past who has apparently injected steroids into the back of his head and made his headaches and optic nerve issues go away.  Patient called EMS this evening and was transported here.  He has been taking his home medications with no relief of his pain.  He also recently had an ingrown toenail removed.  He tells me the pain in his toe is so excruciating that he "cannot function" and is triggering his optic nerve/headache issues.  The history is provided by the patient.       Past Medical History:  Diagnosis Date  . Anemia   . Constipation    due to anti seizure  . Head injury    as a child- does not know details  . Lactose intolerance   . Seizures (HCC)    last seizure in April 2014  . Seizures [facility])     Patient Active Problem List   Diagnosis Date Noted  . Closed fracture of distal end of left radius, initial encounter 02/16/2018    Past Surgical History:  Procedure Laterality Date  . ingrown toe nail  Left   . MANDIBLE FRACTURE SURGERY    . OPEN REDUCTION INTERNAL FIXATION (ORIF) DISTAL RADIAL FRACTURE Left 02/16/2018   Procedure: OPEN REDUCTION INTERNAL FIXATION (ORIF) DISTAL RADIAL FRACTURE;  Surgeon: Dominica Severin, MD;  Location: [facility] OR;  Service: Orthopedics;  Laterality: Left;  Marland Kitchen VAGUS NERVE STIMULATOR INSERTION Left    for seizure control , changed x 1  . WISDOM TOOTH EXTRACTION         Family History  Problem Relation Age of Onset  . Diabetes Mother   . Hypertension Mother   . Hyperlipidemia Mother    . Cancer Father     Social History   Tobacco Use  . Smoking status: Never Smoker  . Smokeless tobacco: Never Used  Vaping Use  . Vaping Use: Never used  Substance Use Topics  . Alcohol use: No  . Drug use: No    Home Medications Prior to Admission medications   Medication Sig Start Date End Date Taking? Authorizing Provider  atomoxetine (STRATTERA) 25 MG capsule Take one capsule daily for one week then take 2 capsules daily 10/10/19   [provider]  butalbital-acetaminophen-caffeine (FIORICET) 50-325-40 MG tablet Take 1 tablet by mouth every 6 (six) hours as needed. 10/13/19   [provider]  calcium carbonate (CALCIUM 600) 600 MG TABS tablet Take by mouth.    [provider]  cloBAZam (ONFI) 10 MG tablet Take 10 mg by mouth 2 (two) times daily.  11/23/17   [provider]  clotrimazole-betamethasone (LOTRISONE) cream Apply to affected area 2 times daily. 10/27/19   Molpus, John, MD  felbamate (FELBATOL) 600 MG tablet Take 600 mg by mouth 3 (three) times daily.    [provider]  ferrous sulfate 325 (65 FE) MG tablet Take by mouth.    [provider]  lacosamide (VIMPAT) 200 MG TABS tablet Take 200 mg by mouth 2 (two) times daily.  [provider]  LORazepam (ATIVAN) 0.5 MG tablet Take 0.5 mg by mouth every 8 (eight) hours.    [provider]  omeprazole (PRILOSEC) 40 MG capsule Take by mouth.    [provider]  phenytoin (PHENYTEK) 300 MG ER capsule Take 300 mg by mouth daily.  08/30/13   [provider]  pregabalin (LYRICA) 150 MG capsule  10/11/19   [provider]  primidone (MYSOLINE) 250 MG tablet Take 250 mg by mouth 2 (two) times daily.     [provider]  promethazine (PHENERGAN) 25 MG tablet  09/15/19   [provider]  tiZANidine (ZANAFLEX) 4 MG tablet Take 4 mg by mouth every 8 (eight) hours as needed. 10/13/19   [provider]  traZODone  (DESYREL) 50 MG tablet Take 50 mg by mouth at bedtime as needed for sleep.    [provider]  fexofenadine (ALLEGRA) 60 MG tablet Take 1 tablet (60 mg total) by mouth 2 (two) times daily. 01/24/18 10/27/19  Dartha Lodge, PA-C    Allergies    Atomoxetine, Citalopram, Penicillins, and Potiga [ezogabine]  Review of Systems   Review of Systems  All other systems reviewed and are negative.   Physical Exam Updated Vital Signs BP 130/82 (BP Location: Right Arm)   Pulse 68   Temp 97.9 F (36.6 C) (Oral)   Resp 18   Ht 5\' 9"  (1.753 m)   Wt 108.8 kg   SpO2 100%   BMI 35.43 kg/m   Physical Exam Vitals and nursing note reviewed.  Constitutional:      General: He is not in acute distress.    Appearance: He is well-developed and well-nourished. He is not diaphoretic.  HENT:     Head: Normocephalic and atraumatic.     Mouth/Throat:     Mouth: Oropharynx is clear and moist.  Eyes:     General: No visual field deficit.    Extraocular Movements: Extraocular movements intact.     Right eye: Normal extraocular motion and no nystagmus.     Left eye: Normal extraocular motion and no nystagmus.     Pupils: Pupils are equal, round, and reactive to light.     Comments: Funduscopic examination is essentially unremarkable bilaterally.  I see no disc edema or other abnormality.  Cardiovascular:     Rate and Rhythm: Normal rate and regular rhythm.     Heart sounds: No murmur heard. No friction rub.  Pulmonary:     Effort: Pulmonary effort is normal. No respiratory distress.     Breath sounds: Normal breath sounds. No wheezing or rales.  Abdominal:     General: Bowel sounds are normal. There is no distension.     Palpations: Abdomen is soft.     Tenderness: There is no abdominal tenderness.  Musculoskeletal:        General: No edema. Normal range of motion.     Cervical back: Normal range of motion and neck supple.  Skin:    General: Skin is warm and dry.  Neurological:      Mental Status: He is alert and oriented to person, place, and time.     Cranial Nerves: No cranial nerve deficit, dysarthria or facial asymmetry.     Sensory: No sensory deficit.     Coordination: Coordination normal.     ED Results / Procedures / Treatments   Labs (all labs ordered are listed, but only abnormal results are displayed) Labs Reviewed - No data to  display  EKG None  Radiology No results found.  Procedures Procedures   Medications Ordered in ED Medications  ketorolac (TORADOL) injection 60 mg (has no administration in time range)    ED Course  I have reviewed the triage vital signs and the nursing notes.  Pertinent labs & imaging results that were available during my care of the patient were reviewed by me and considered in my medical decision making (see chart for details).    MDM Rules/Calculators/A&P  Patient is a 30 year old male presenting with complaints of headache and toe pain as described in the HPI.  Patient appears clinically well and is in no acute distress.  He is neurologically intact and CT scan of the head is unremarkable.  Laboratory studies are unremarkable and Dilantin level is therapeutic.  Patient's toe appears to be healing appropriately.  There is some granulation tissue to the nailbed where the nail was removed, but no significant erythema or purulent drainage.  Patient is insistent that his toe is infected.  I have agreed to prescribe a short course of antibiotics although I think this is marginally indicated.  He is also requested pain medication on multiple occasions.  He was given IM Toradol, but tells me this did not help.  Patient will be given 2 Norco here in the emergency department and discharged with tramadol.  He is told me the tramadol does not help, but I am uncomfortable prescribing him anything stronger as I do not feel this is indicated given his clinical appearance and nature of his presentation.  He is to follow-up with  his neurologist on Monday if his symptoms are not improving.  Final Clinical Impression(s) / ED Diagnoses Final diagnoses:  None    Rx / DC Orders ED Discharge Orders    None       Geoffery Lyons, MD 04/14/20 [phone]

## 2020-04-14 NOTE — ED Triage Notes (Signed)
Per EMS- headache, hx of optical nerve pain; year ago prior, headache started 4 hours ago sensitive to light; ingrown toenail pain; pt reports "nerve pain in the head"; pt says his "big toe has been hurting, my pain level has been out of control and that set it off"; pt is alert and oriented, ambulatory and denies numbness or weakness; BEFAST neg and no speech abnormalities

## 2020-04-14 NOTE — Discharge Instructions (Addendum)
Begin taking tramadol as prescribed as needed for pain.  Take Keflex as prescribed.  Follow-up with your neurologist on Monday if symptoms are not improving.

## 2020-05-15 ENCOUNTER — Emergency Department (HOSPITAL_COMMUNITY)
Admission: EM | Admit: 2020-05-15 | Discharge: 2020-05-16 | Disposition: A | Discharge status: Home / Self Care | Payer: Medicare Other | Attending: Emergency Medicine | Admitting: Emergency Medicine

## 2020-05-15 ENCOUNTER — Encounter (HOSPITAL_COMMUNITY): Payer: Self-pay

## 2020-05-15 ENCOUNTER — Emergency Department (HOSPITAL_COMMUNITY): Payer: Medicare Other

## 2020-05-15 DIAGNOSIS — R059 Cough, unspecified: Secondary | ICD-10-CM | POA: Diagnosis not present

## 2020-05-15 DIAGNOSIS — R0981 Nasal congestion: Secondary | ICD-10-CM | POA: Diagnosis not present

## 2020-05-15 DIAGNOSIS — R55 Syncope and collapse: Secondary | ICD-10-CM | POA: Diagnosis not present

## 2020-05-15 DIAGNOSIS — J029 Acute pharyngitis, unspecified: Secondary | ICD-10-CM | POA: Insufficient documentation

## 2020-05-15 DIAGNOSIS — H5713 Ocular pain, bilateral: Secondary | ICD-10-CM | POA: Insufficient documentation

## 2020-05-15 DIAGNOSIS — R042 Hemoptysis: Secondary | ICD-10-CM | POA: Insufficient documentation

## 2020-05-15 DIAGNOSIS — J3489 Other specified disorders of nose and nasal sinuses: Secondary | ICD-10-CM | POA: Diagnosis not present

## 2020-05-15 DIAGNOSIS — Z20822 Contact with and (suspected) exposure to covid-19: Secondary | ICD-10-CM | POA: Insufficient documentation

## 2020-05-15 DIAGNOSIS — R519 Headache, unspecified: Secondary | ICD-10-CM | POA: Insufficient documentation

## 2020-05-15 DIAGNOSIS — Z79899 Other long term (current) drug therapy: Secondary | ICD-10-CM | POA: Diagnosis not present

## 2020-05-15 DIAGNOSIS — R0789 Other chest pain: Secondary | ICD-10-CM | POA: Diagnosis not present

## 2020-05-15 DIAGNOSIS — G8929 Other chronic pain: Secondary | ICD-10-CM | POA: Insufficient documentation

## 2020-05-15 DIAGNOSIS — R4020 Unspecified coma: Secondary | ICD-10-CM

## 2020-05-15 DIAGNOSIS — R0602 Shortness of breath: Secondary | ICD-10-CM | POA: Diagnosis not present

## 2020-05-15 DIAGNOSIS — R42 Dizziness and giddiness: Secondary | ICD-10-CM | POA: Diagnosis not present

## 2020-05-15 LAB — CBC WITH DIFFERENTIAL/PLATELET
Abs Immature Granulocytes: 0.01 10*3/uL (ref 0.00–0.07)
Basophils Absolute: 0 10*3/uL (ref 0.0–0.1)
Basophils Relative: 0 %
Eosinophils Absolute: 0.1 10*3/uL (ref 0.0–0.5)
Eosinophils Relative: 3 %
HCT: 41.9 % (ref 39.0–52.0)
Hemoglobin: 14.4 g/dL (ref 13.0–17.0)
Immature Granulocytes: 0 %
Lymphocytes Relative: 54 %
Lymphs Abs: 1.6 10*3/uL (ref 0.7–4.0)
MCH: 32.7 pg (ref 26.0–34.0)
MCHC: 34.4 g/dL (ref 30.0–36.0)
MCV: 95.2 fL (ref 80.0–100.0)
Monocytes Absolute: 0.3 10*3/uL (ref 0.1–1.0)
Monocytes Relative: 10 %
Neutro Abs: 1 10*3/uL — ABNORMAL LOW (ref 1.7–7.7)
Neutrophils Relative %: 33 %
Platelets: 261 10*3/uL (ref 150–400)
RBC: 4.4 MIL/uL (ref 4.22–5.81)
RDW: 12 % (ref 11.5–15.5)
WBC: 3 10*3/uL — ABNORMAL LOW (ref 4.0–10.5)
nRBC: 0 % (ref 0.0–0.2)

## 2020-05-15 LAB — COMPREHENSIVE METABOLIC PANEL
ALT: 43 U/L (ref 0–44)
AST: 54 U/L — ABNORMAL HIGH (ref 15–41)
Albumin: 4.2 g/dL (ref 3.5–5.0)
Alkaline Phosphatase: 111 U/L (ref 38–126)
Anion gap: 9 (ref 5–15)
BUN: 11 mg/dL (ref 6–20)
CO2: 25 mmol/L (ref 22–32)
Calcium: 8.9 mg/dL (ref 8.9–10.3)
Chloride: 99 mmol/L (ref 98–111)
Creatinine, Ser: 0.85 mg/dL (ref 0.61–1.24)
GFR, Estimated: >60 mL/min (ref >60)
Glucose, Bld: 81 mg/dL (ref 70–99)
Potassium: 4.4 mmol/L (ref 3.5–5.1)
Sodium: 133 mmol/L — ABNORMAL LOW (ref 135–145)
Total Bilirubin: 0.9 mg/dL (ref 0.3–1.2)
Total Protein: 7.7 g/dL (ref 6.5–8.1)

## 2020-05-15 LAB — TROPONIN I (HIGH SENSITIVITY)
Troponin I (High Sensitivity): 4 ng/L (ref <18)
Troponin I (High Sensitivity): 4 ng/L (ref <18)

## 2020-05-15 LAB — RAPID HIV SCREEN (HIV 1/2 AB+AG)
HIV 1/2 Antibodies: NONREACTIVE
HIV-1 P24 Antigen - HIV24: NONREACTIVE

## 2020-05-15 LAB — D-DIMER, QUANTITATIVE: D-Dimer, Quant: <0.27 ug/mL-FEU (ref 0.00–0.50)

## 2020-05-15 MED ORDER — BENZONATATE 100 MG PO CAPS: 200.0000 mg | ORAL_CAPSULE | Freq: Three times a day (TID) | ORAL | 0 refills | Status: AC | PRN

## 2020-05-15 MED ORDER — AZITHROMYCIN 250 MG PO TABS: 250.0000 mg | ORAL_TABLET | Freq: Every day | ORAL | 0 refills | Status: DC

## 2020-05-15 MED ORDER — AZITHROMYCIN 250 MG PO TABS: 250.0000 mg | ORAL_TABLET | Freq: Every day | ORAL | 0 refills | Status: AC

## 2020-05-15 MED ORDER — DEXAMETHASONE SODIUM PHOSPHATE 10 MG/ML IJ SOLN
10.0000 mg | Freq: Once | INTRAMUSCULAR | Status: AC
  Administered 2020-05-15: 10 mg via INTRAVENOUS
  Filled 2020-05-15: qty 1

## 2020-05-15 MED ORDER — DIPHENHYDRAMINE HCL 50 MG/ML IJ SOLN
12.5000 mg | Freq: Once | INTRAMUSCULAR | Status: AC
  Administered 2020-05-15: 12.5 mg via INTRAVENOUS
  Filled 2020-05-15: qty 1

## 2020-05-15 MED ORDER — METOCLOPRAMIDE HCL 5 MG/ML IJ SOLN
10.0000 mg | Freq: Once | INTRAMUSCULAR | Status: AC
  Administered 2020-05-15: 10 mg via INTRAVENOUS
  Filled 2020-05-15: qty 2

## 2020-05-15 MED ORDER — SODIUM CHLORIDE 0.9 % IV BOLUS
1000.0000 mL | Freq: Once | INTRAVENOUS | Status: AC
  Administered 2020-05-15: 1000 mL via INTRAVENOUS

## 2020-05-15 MED ORDER — BUTALBITAL-APAP-CAFFEINE 50-325-40 MG PO TABS
1.0000 | ORAL_TABLET | Freq: Once | ORAL | Status: AC
  Administered 2020-05-15: 1 via ORAL
  Filled 2020-05-15: qty 1

## 2020-05-15 NOTE — ED Notes (Signed)
Patient throwing himself around in the bed. This RN reminded patient the importance of not doing so to prevent him from falling out of the bed. Patient also refusing to keep mask on at this time. Patient coughing historically in the hallway.   Patient also swinging arms around and yanking dinamap around. Due to safety, dinamap cords unhooked from patient by tech.

## 2020-05-15 NOTE — ED Notes (Signed)
Asked pt X2 to please put his surgical mask back in place because he needed to be protected from germs, pt stated "I'm not wearing this mask when I already feel short of breath". Tried to better influence him but he still would not wear it

## 2020-05-15 NOTE — ED Triage Notes (Signed)
Pt bibems from home. Pt c/o SOB, coughing up blood and weakness x1 week. Pt also states he might have possibly had a syncopal episode but unsure of much detail. Pt has a hx of seizures. Pt also c/o cp when coughing and deep breathing. Pt alert and oriented x4.

## 2020-05-15 NOTE — ED Notes (Signed)
Pt in CT at this time.

## 2020-05-15 NOTE — ED Notes (Signed)
Patient unable to tolerate orthostatics at this time. While obtaining Standing vitals patient sat back down stating that he feels dizzy and cold. Provider made aware at this time.

## 2020-05-15 NOTE — ED Notes (Signed)
Pt ask multiple times to keep mask on while in hall way, pt coughing and leaning over rail, when Daija, RN attempted to keep patient from leaning over side rail pt responded "get your hand off me". Pt again reminded to use emesis bag and to use face coverings.
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
## 2020-05-15 NOTE — Discharge Instructions (Addendum)
You were seen in the emergency department for several concerns including headache, bloody cough, chest pain, shortness of breath, dizziness, sweats, possible passing out episode or seizure   Extensive lab and imaging studies were done today and everything is normal  We obtained imaging of her head, cervical spine and chest and there were no abnormalities.  Your vagal nerve stimulator is in place  Your TB test and Covid and influenza test are pending.  See below instructions on how to create a MyChart see can check on your results in the next 24 hours  We will treat your symptoms with an antibiotic called azithromycin.  Take 200 mg of benzonatate every 8 hours for cough.  Stay hydrated.  The cause of your passing out episode is unclear. It could be that you were dehydrated and fainted when standing too fast.  Forceful coughing can induce a passing out episode as well. You could have also had a seizure.   Call your primary care doctor and schedule an appointment in the next 48 hours for recheck  Return to the ED for worsening or new symptoms

## 2020-05-15 NOTE — ED Notes (Signed)
Pt returned from radiology via stretcher

## 2020-05-16 DIAGNOSIS — R55 Syncope and collapse: Secondary | ICD-10-CM | POA: Diagnosis not present

## 2020-05-16 LAB — SARS CORONAVIRUS 2 (TAT 6-24 HRS): SARS Coronavirus 2: NEGATIVE

## 2020-05-16 NOTE — ED Notes (Signed)
Pt advised he could stay here until AM and receive a bus pass. Pt reports his POA is out of town. Pt is [facility]

## 2020-05-16 NOTE — ED Notes (Signed)
Attempts made to contact Trevor Baker, pt states this is his POA to assist with finding a ride home for the patient. No answer at this time. Pt is [facility].

## 2020-05-16 NOTE — ED Notes (Signed)
Patient able to find a ride from a friend. States the friend will be here in 15 mins to pick him up.

## 2020-05-18 LAB — QUANTIFERON-TB GOLD PLUS (RQFGPL)
QuantiFERON Mitogen Value: 8.18 IU/mL
QuantiFERON Nil Value: 0.01 IU/mL
QuantiFERON TB1 Ag Value: 0.01 IU/mL
QuantiFERON TB2 Ag Value: 0.02 IU/mL

## 2020-05-18 LAB — QUANTIFERON-TB GOLD PLUS: QuantiFERON-TB Gold Plus: NEGATIVE

## 2023-02-27 IMAGING — CT CT HEAD W/O CM
3 series · 14 of 47 positions shown, 16 images · non-contrast
Comparison: November 20, 2019

CLINICAL DATA: Headache

EXAM:
CT HEAD WITHOUT CONTRAST
TECHNIQUE: Contiguous axial images were obtained from the base of the skull
through the vertex without intravenous contrast.

[Series 2: head wo · axial · 0.49mm/px · z∈[-359,-234]mm · 8 of 31 slices shown, 10 images]
[im 3/31  brain]
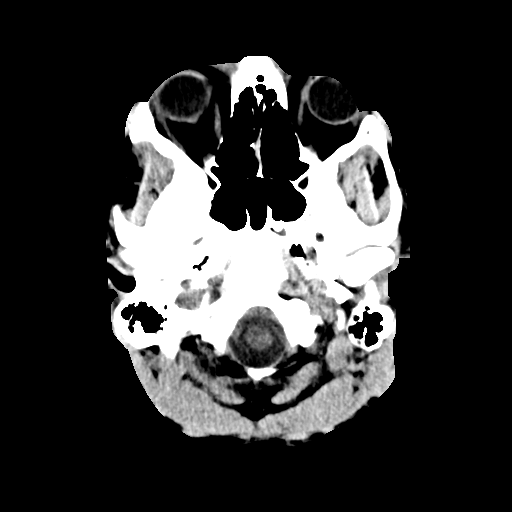
[im 3/31  bone]
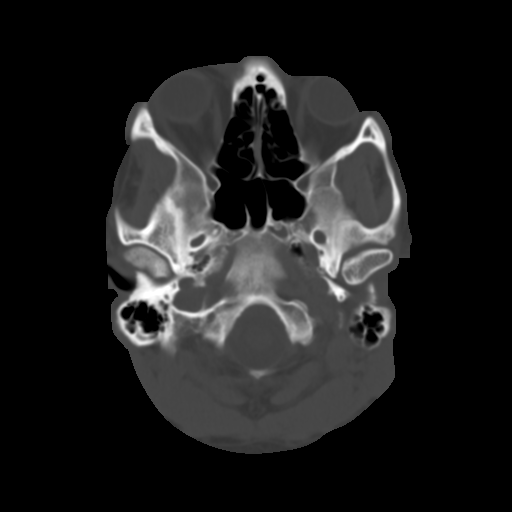
[im 7/31  brain]
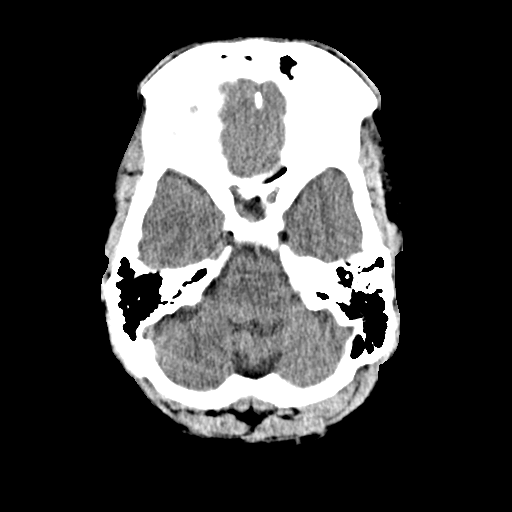
[im 10/31  brain]
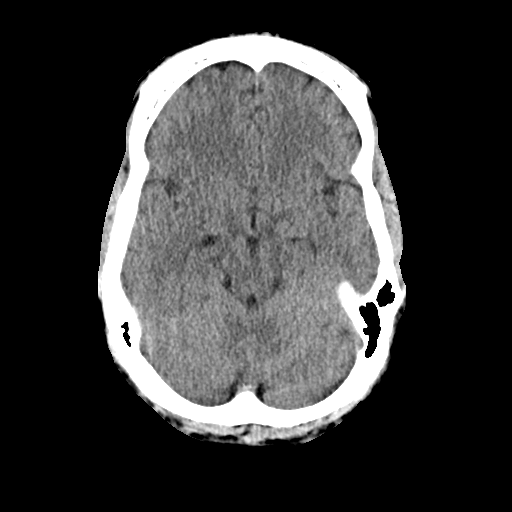
[im 14/31  brain]
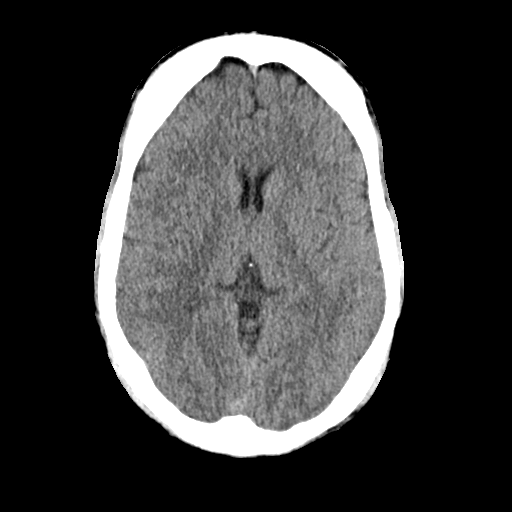
[im 17/31  brain]
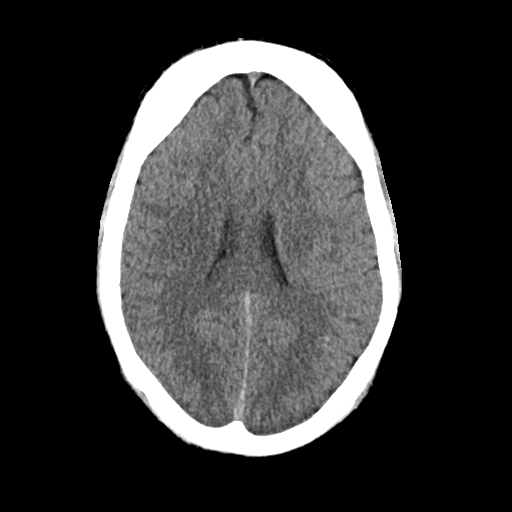
[im 17/31  bone]
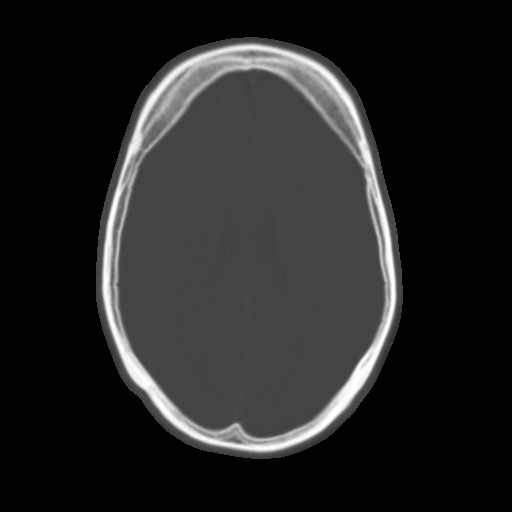
[im 21/31  brain]
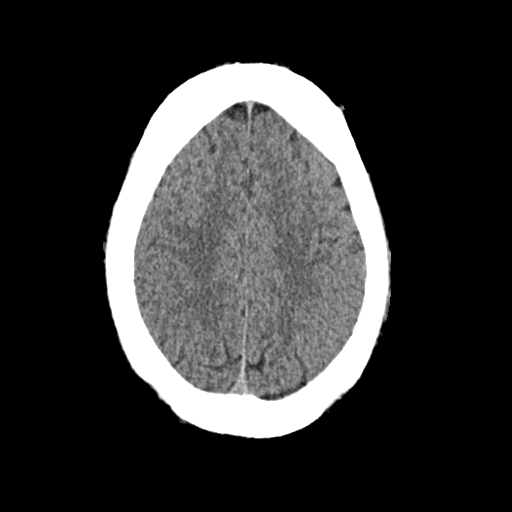
[im 24/31  brain]
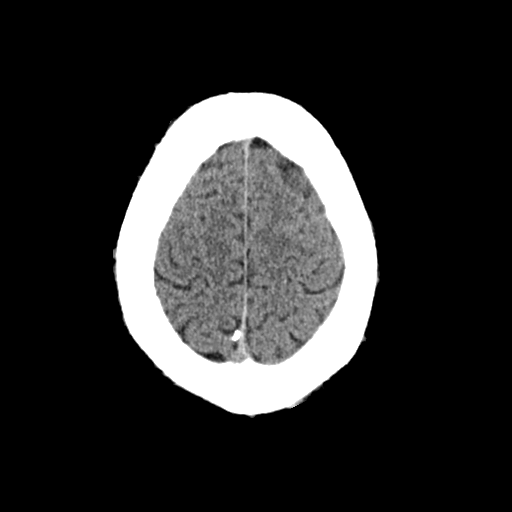
[im 28/31  brain]
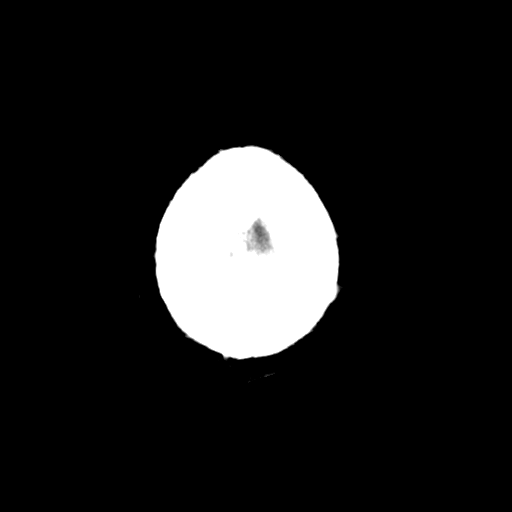

[Series 4: coronal soft · coronal · 0.30mm/px · 3 of 76 slices shown]
[im 26/76  brain]
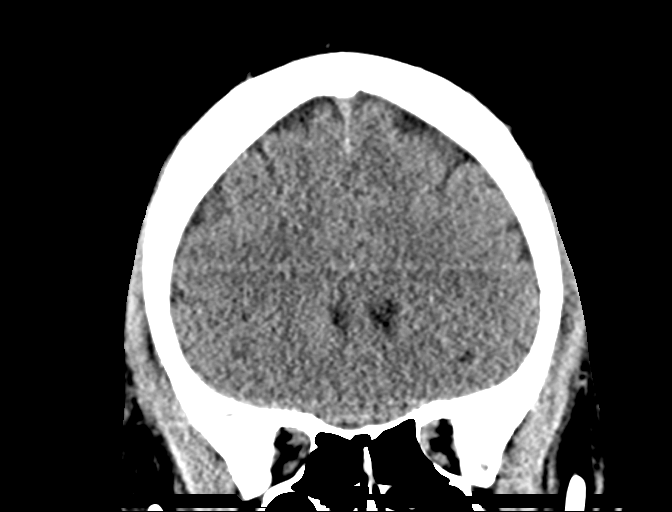
[im 34/76  brain]
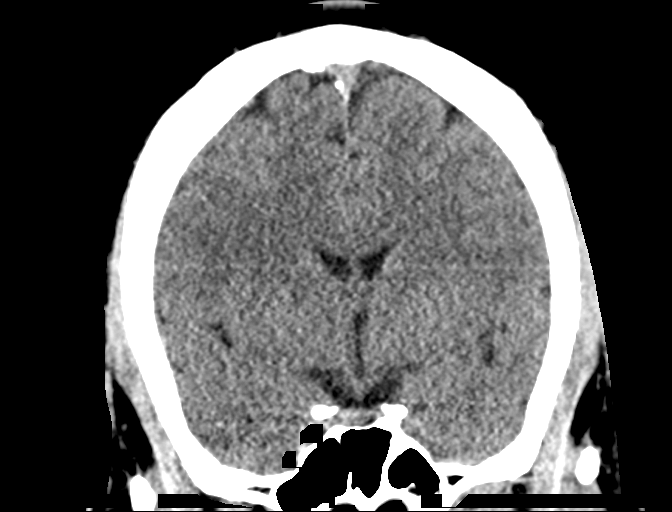
[im 42/76  brain]
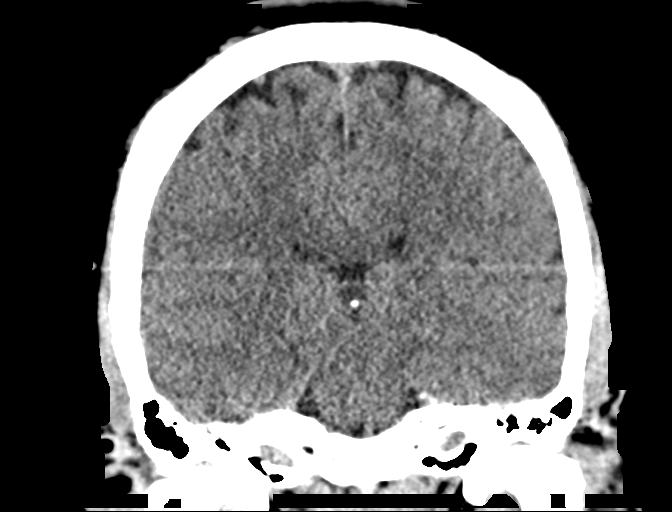

[Series 5: sag soft · sagittal · 0.31mm/px · 3 of 59 slices shown]
[im 20/59  brain]
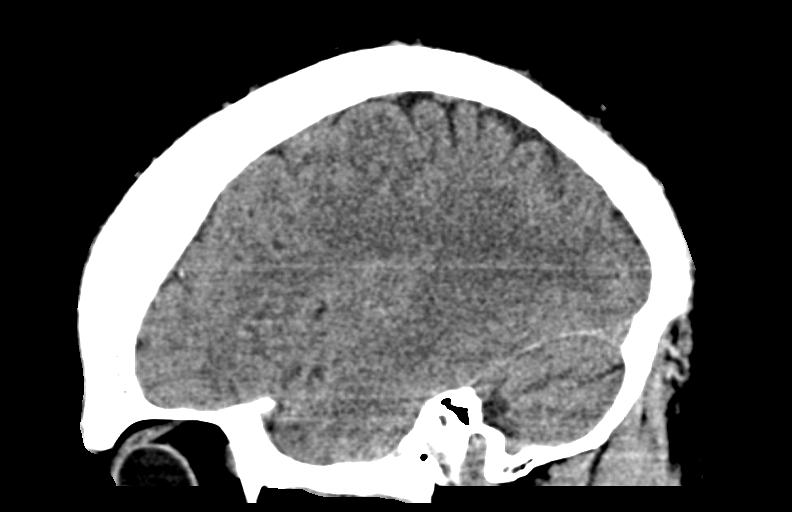
[im 30/59  brain]
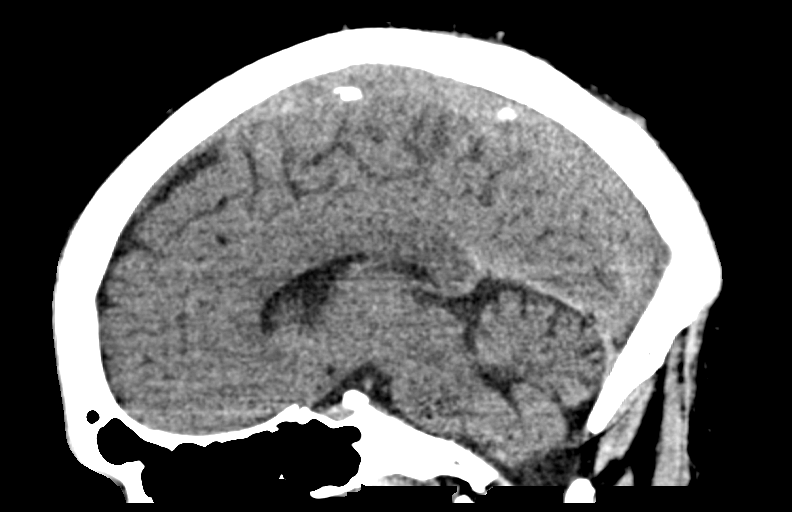
[im 39/59  brain]
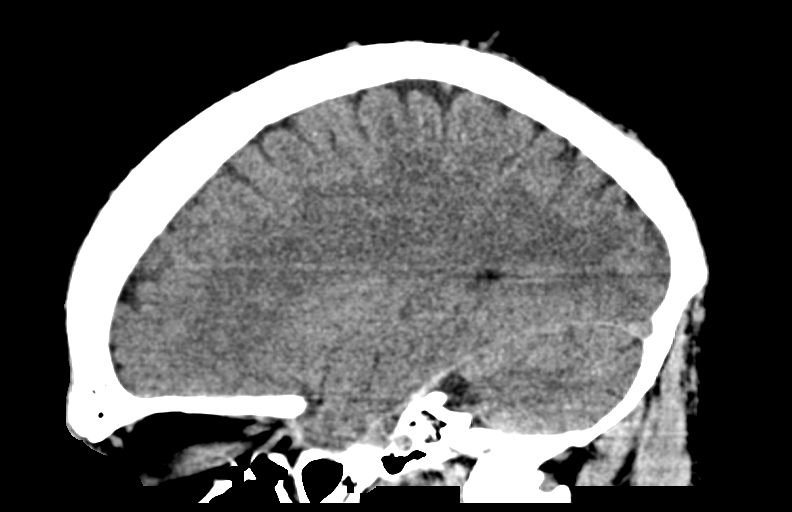

[14 of 47 positions shown; findings below may reference images not displayed]

FINDINGS: Brain: No evidence of acute territorial infarction, hemorrhage,
hydrocephalus,extra-axial collection or mass lesion/mass effect.
Normal gray-white differentiation. Ventricles are normal in size and
contour.

Vascular: No hyperdense vessel or unexpected calcification.

Skull: The skull is intact. No fracture or focal lesion identified.

Sinuses/Orbits: The visualized paranasal sinuses and mastoid air
cells are clear. The orbits and globes intact.

Other: None
IMPRESSION: No acute intracranial abnormality.

## 2023-03-30 IMAGING — CT CT HEAD W/O CM
4 series · 16 of 47 positions shown, 18 images · non-contrast
Comparison: Most recent head CT six days ago 05/09/2020 at [REDACTED]

CLINICAL DATA: Syncope.  Weakness.  History of seizure.

EXAM:
CT HEAD WITHOUT CONTRAST
TECHNIQUE: Contiguous axial images were obtained from the base of the skull
through the vertex without intravenous contrast.

[Series 3: head wo · axial · 0.48mm/px · z∈[+1440,+1565]mm · 7 of 35 slices shown, 9 images]
[im 5/35  brain]
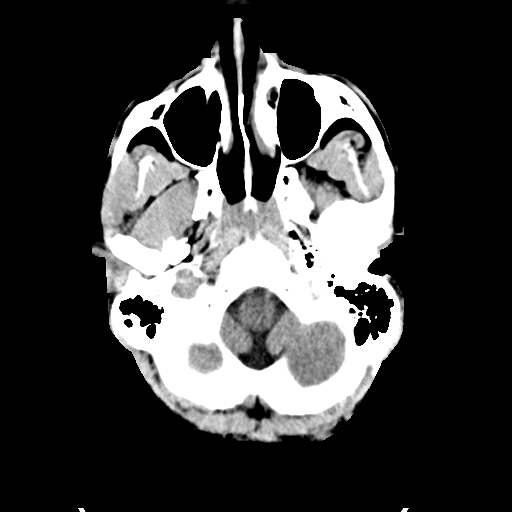
[im 5/35  bone]
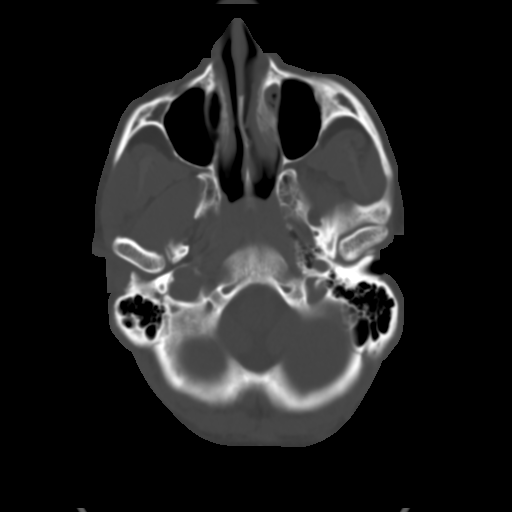
[im 9/35  brain]
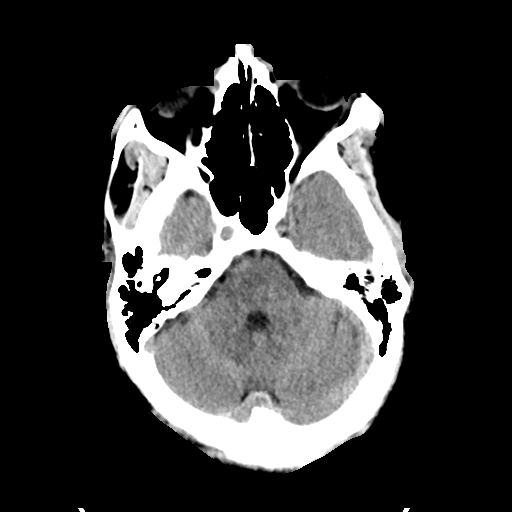
[im 13/35  brain]
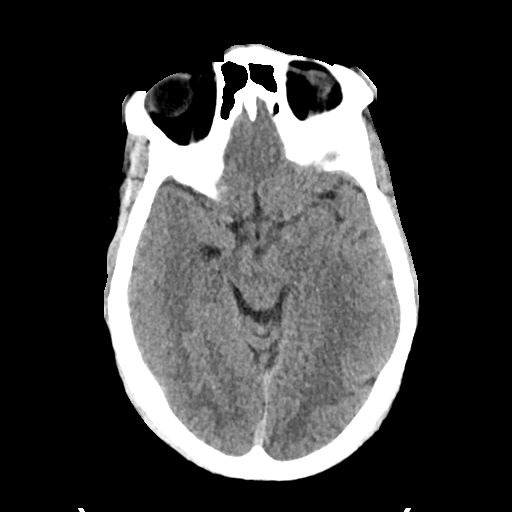
[im 18/35  brain]
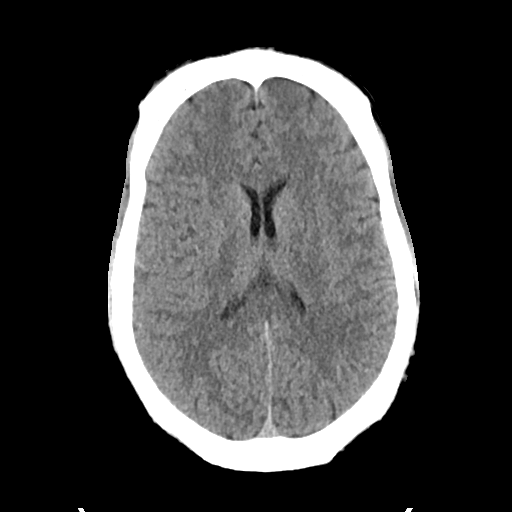
[im 22/35  brain]
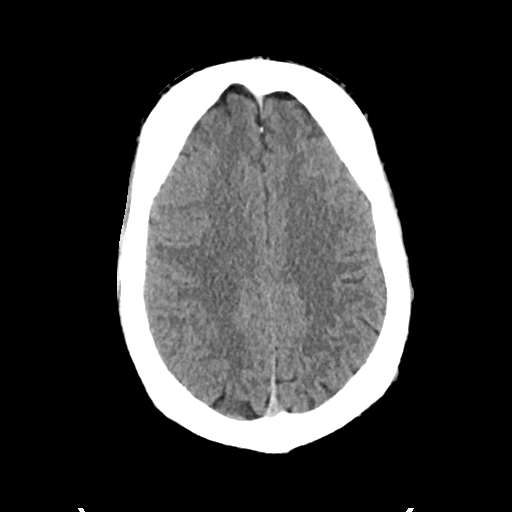
[im 22/35  bone]
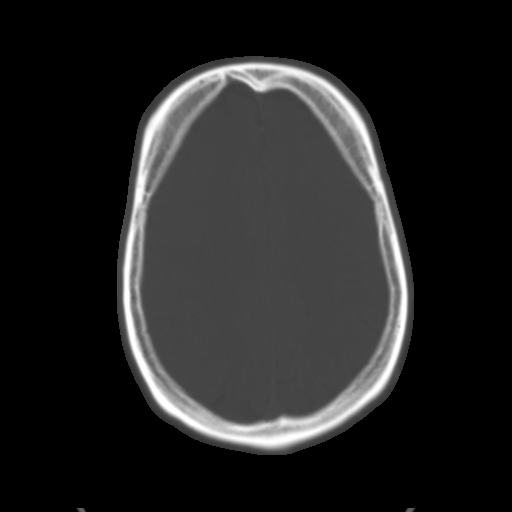
[im 26/35  brain]
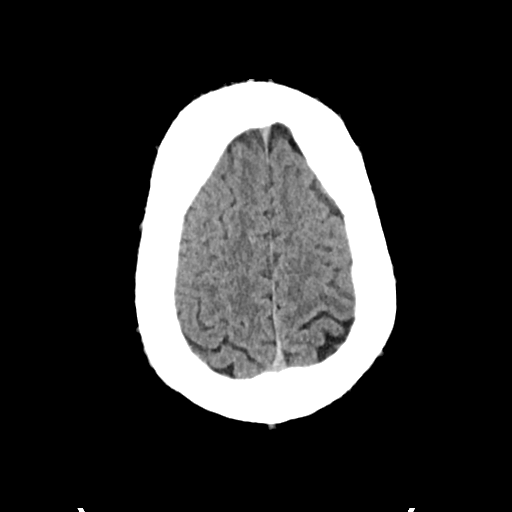
[im 30/35  brain]
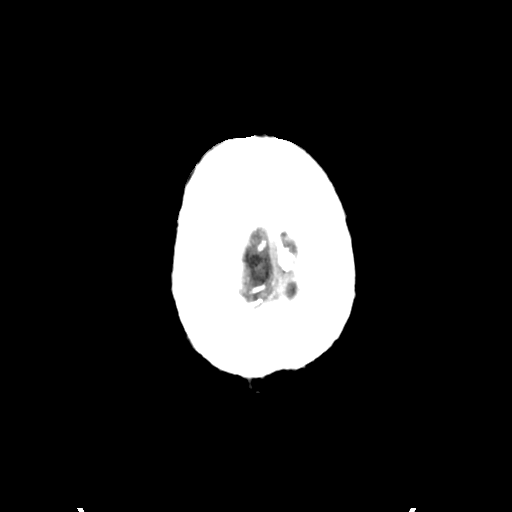

[Series 4: head bone · axial · 0.48mm/px · z∈[+1436,+1470]mm · 3 of 86 slices shown]
[im 9/86  bone]
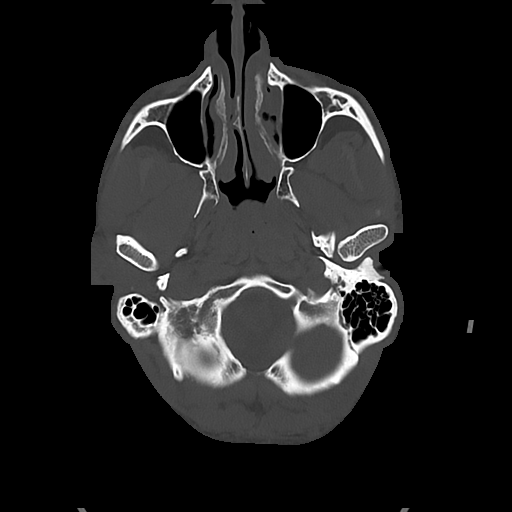
[im 18/86  bone]
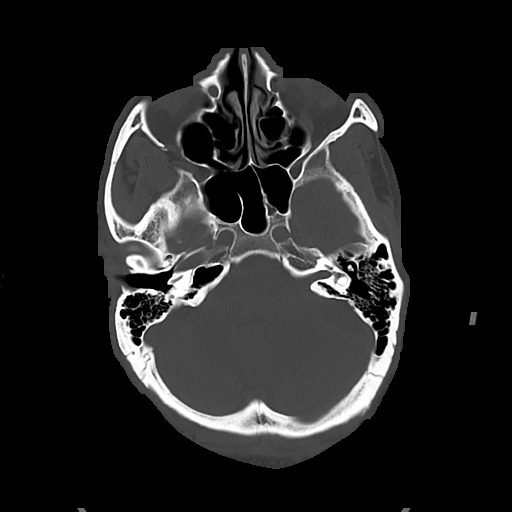
[im 26/86  bone]
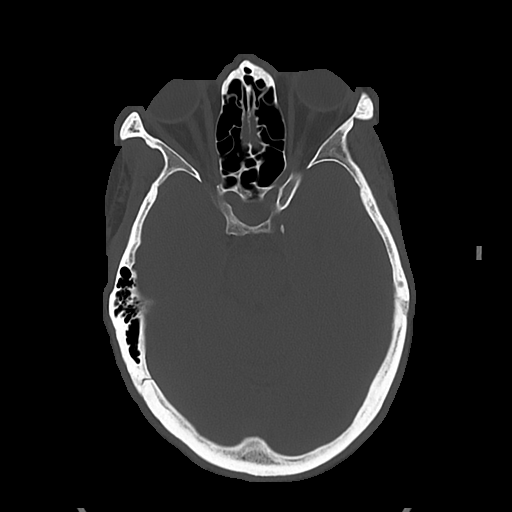

[Series 5: cor soft · coronal · 0.33mm/px · 3 of 79 slices shown]
[im 27/79  brain]
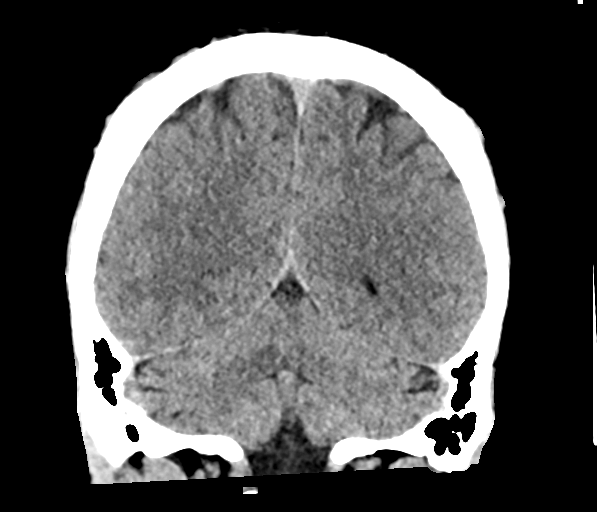
[im 35/79  brain]
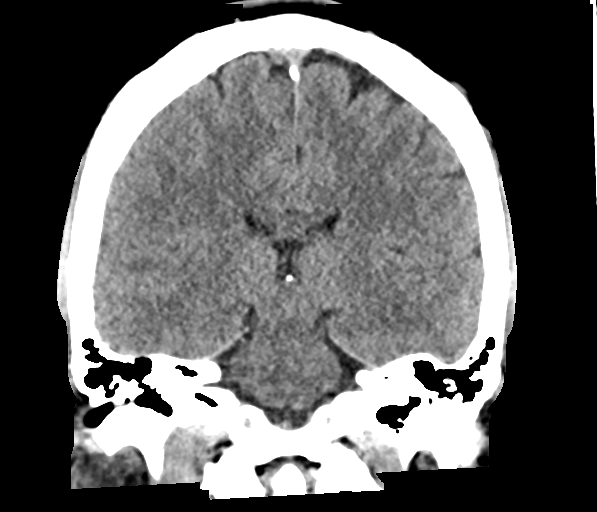
[im 44/79  brain]
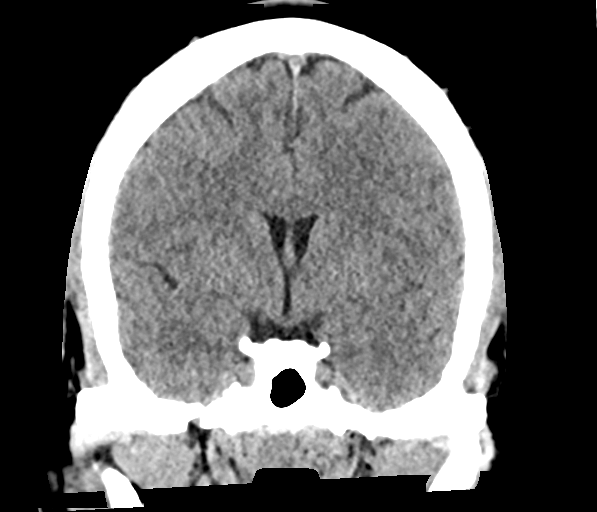

[Series 6: sag soft · sagittal · 0.33mm/px · 3 of 66 slices shown]
[im 22/66  brain]
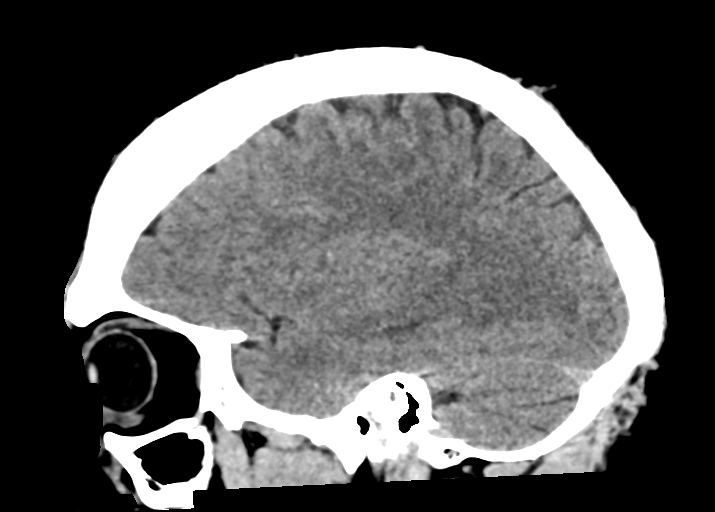
[im 33/66  brain]
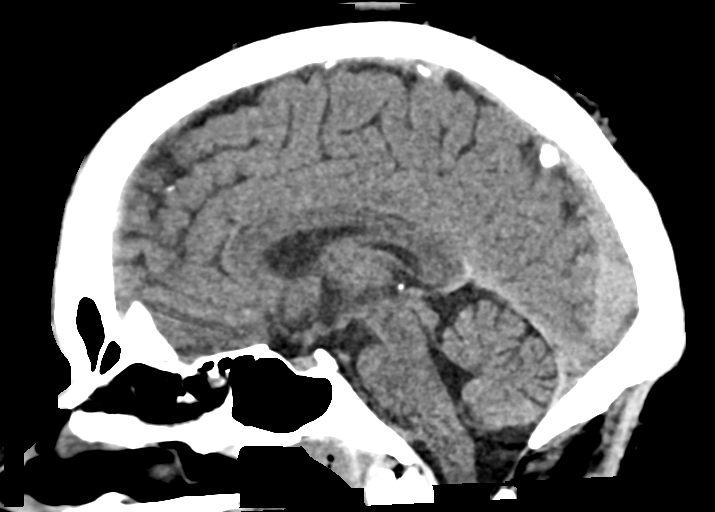
[im 44/66  brain]
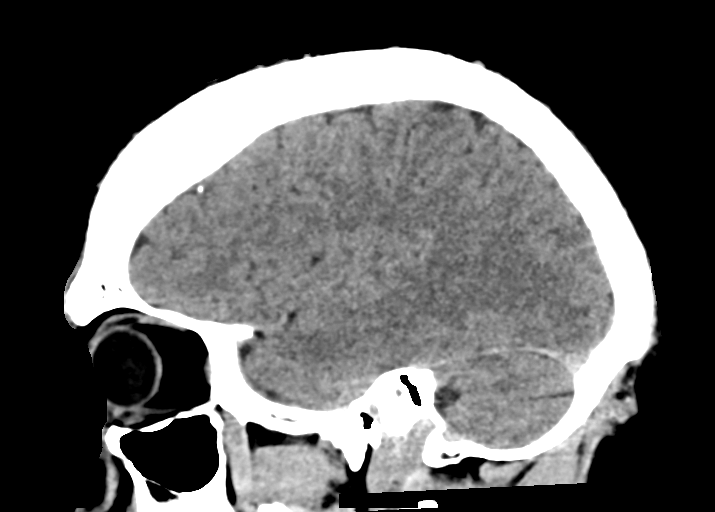

[16 of 47 positions shown; findings below may reference images not displayed]

FINDINGS: Brain: No intracranial hemorrhage, mass effect, or midline shift. No
hydrocephalus. The basilar cisterns are patent. No evidence of
territorial infarct or acute ischemia. No extra-axial or
intracranial fluid collection.

Vascular: No hyperdense vessel.

Skull: No fracture or focal lesion.

Sinuses/Orbits: Paranasal sinuses and mastoid air cells are clear.
The visualized orbits are unremarkable.

Other: None.
IMPRESSION: Negative noncontrast head CT.

## 2023-10-16 ENCOUNTER — Encounter (HOSPITAL_BASED_OUTPATIENT_CLINIC_OR_DEPARTMENT_OTHER): Payer: Self-pay

## 2023-10-16 ENCOUNTER — Emergency Department (HOSPITAL_BASED_OUTPATIENT_CLINIC_OR_DEPARTMENT_OTHER)

## 2023-10-16 ENCOUNTER — Emergency Department (HOSPITAL_BASED_OUTPATIENT_CLINIC_OR_DEPARTMENT_OTHER)
Admission: EM | Admit: 2023-10-16 | Discharge: 2023-10-16 | Disposition: A | Discharge status: Home / Self Care | Attending: Emergency Medicine | Admitting: Emergency Medicine

## 2023-10-16 DIAGNOSIS — R251 Tremor, unspecified: Secondary | ICD-10-CM | POA: Diagnosis present

## 2023-10-16 DIAGNOSIS — D709 Neutropenia, unspecified: Secondary | ICD-10-CM | POA: Insufficient documentation

## 2023-10-16 LAB — BASIC METABOLIC PANEL WITH GFR
Anion gap: 11 (ref 5–15)
BUN: 7 mg/dL (ref 6–20)
CO2: 28 mmol/L (ref 22–32)
Calcium: 9.8 mg/dL (ref 8.9–10.3)
Chloride: 102 mmol/L (ref 98–111)
Creatinine, Ser: 1.06 mg/dL (ref 0.61–1.24)
GFR, Estimated: >60 mL/min (ref >60)
Glucose, Bld: 97 mg/dL (ref 70–99)
Potassium: 4.1 mmol/L (ref 3.5–5.1)
Sodium: 140 mmol/L (ref 135–145)

## 2023-10-16 LAB — CBC WITH DIFFERENTIAL/PLATELET
Abs Immature Granulocytes: 0.01 K/uL (ref 0.00–0.07)
Basophils Absolute: 0 K/uL (ref 0.0–0.1)
Basophils Relative: 0 %
Eosinophils Absolute: 0 K/uL (ref 0.0–0.5)
Eosinophils Relative: 1 %
HCT: 41.7 % (ref 39.0–52.0)
Hemoglobin: 14.3 g/dL (ref 13.0–17.0)
Immature Granulocytes: 0 %
Lymphocytes Relative: 38 %
Lymphs Abs: 1.2 K/uL (ref 0.7–4.0)
MCH: 31.6 pg (ref 26.0–34.0)
MCHC: 34.3 g/dL (ref 30.0–36.0)
MCV: 92.3 fL (ref 80.0–100.0)
Monocytes Absolute: 0.2 K/uL (ref 0.1–1.0)
Monocytes Relative: 7 %
Neutro Abs: 1.7 K/uL (ref 1.7–7.7)
Neutrophils Relative %: 54 %
Platelets: 227 K/uL (ref 150–400)
RBC: 4.52 MIL/uL (ref 4.22–5.81)
RDW: 12.1 % (ref 11.5–15.5)
WBC: 3.2 K/uL — ABNORMAL LOW (ref 4.0–10.5)
nRBC: 0 % (ref 0.0–0.2)

## 2023-10-16 NOTE — ED Provider Notes (Signed)
 [facility] Provider Note   CSN: [id number] Arrival date & time: 10/16/23  9044     Patient presents with: Tremors   Trevor Baker is a 33 y.o. male with history of epilepsy on lacosamide  and with vagal nerve stimulator and following with neurologist Dr. Camellia Custard, presents with concern for shaking in his hands that has been ongoing for the past week.  Reports that about 1 week ago he had an episode where he was shaking in both of his hands uncontrollably.  This seemed to resolve on its own, but he had another episode today where he had shaking in his right hand that started about 7 AM today.  He reports he is shaking episodes seem to improve when he drinks caffeinated beverages.  He denies any loss of consciousness or any full body convulsions during these episodes.  He reports he has been compliant with his lacosamide  at home.  Reports he has been under a lot of stress recently with his niece recently passing, and is unsure if this could be contributing to his symptoms today.  He also reports this morning when getting out of the bathtub, he thinks he hit the left side of his head on the tub.  He denies any loss of consciousness.  He is not on any anticoagulation.   HPI     Prior to Admission medications   Medication Sig Start Date End Date Taking? Authorizing Provider  atomoxetine (STRATTERA) 25 MG capsule Take one capsule daily for one week then take 2 capsules daily 10/10/19   [provider]  azithromycin  (ZITHROMAX ) 250 MG tablet Take 1 tablet (250 mg total) by mouth daily. Take first 2 tablets together, then 1 every day until finished. 05/15/20   Gibbons, Claudia J, PA-C  butalbital -acetaminophen -caffeine  (FIORICET ) 50-325-40 MG tablet Take 1 tablet by mouth every 6 (six) hours as needed. 10/13/19   [provider]  calcium carbonate (OS-CAL) 600 MG TABS tablet Take by mouth.    [provider]  cephALEXin   (KEFLEX ) 500 MG capsule Take 1 capsule (500 mg total) by mouth 4 (four) times daily. 04/14/20   Geroldine Berg, MD  cloBAZam  (ONFI ) 10 MG tablet Take 10 mg by mouth 2 (two) times daily.  11/23/17   [provider]  clotrimazole -betamethasone  (LOTRISONE ) cream Apply to affected area 2 times daily. 10/27/19   Molpus, John, MD  diclofenac (VOLTAREN) 75 MG EC tablet Take 75 mg by mouth 2 (two) times daily.    [provider]  felbamate  (FELBATOL ) 600 MG tablet Take 600 mg by mouth 3 (three) times daily.    [provider]  ferrous sulfate 325 (65 FE) MG tablet Take by mouth.    [provider]  FLUoxetine (PROZAC) 10 MG tablet Take 10 mg by mouth daily.    [provider]  lacosamide  (VIMPAT ) 200 MG TABS tablet Take 200 mg by mouth 2 (two) times daily.    [provider]  LORazepam  (ATIVAN ) 0.5 MG tablet Take 0.5 mg by mouth every 8 (eight) hours.    [provider]  omeprazole (PRILOSEC) 40 MG capsule Take by mouth.    [provider]  ondansetron  (ZOFRAN ) 8 MG tablet Take by mouth every 8 (eight) hours as needed for nausea or vomiting.    [provider]  phenytoin  (DILANTIN ) 300 MG ER capsule Take 300 mg by mouth daily.  08/30/13   [provider]  pregabalin (LYRICA) 150 MG capsule  10/11/19   [provider]  primidone  (MYSOLINE ) 250 MG tablet Take 250 mg by mouth 2 (two) times daily.     [provider]  promethazine  (PHENERGAN ) 25 MG tablet  09/15/19   [provider]  tiZANidine (ZANAFLEX) 4 MG tablet Take 4 mg by mouth every 8 (eight) hours as needed. 10/13/19   [provider]  traMADol  (ULTRAM ) 50 MG tablet Take 1 tablet (50 mg total) by mouth every 6 (six) hours as needed. 04/14/20   Geroldine Berg, MD  traZODone (DESYREL) 50 MG tablet Take 50 mg by mouth at bedtime as needed for sleep.    [provider]  fexofenadine  (ALLEGRA ) 60 MG tablet Take 1 tablet (60 mg  total) by mouth 2 (two) times daily. 01/24/18 10/27/19  Alva Larraine FALCON, PA-C    Allergies: Atomoxetine, Citalopram, Penicillins, and Potiga [ezogabine]    Review of Systems  Neurological:  Positive for tremors.    Updated Vital Signs BP (!) 178/100 (BP Location: Left Leg)   Pulse 97   Temp 98.1 F (36.7 C) (Oral)   Resp 17   Wt 113.4 kg   SpO2 100%   BMI 36.92 kg/m   Physical Exam Vitals and nursing note reviewed.  Constitutional:      General: He is not in acute distress.    Appearance: He is well-developed.  HENT:     Head: Normocephalic and atraumatic.  Eyes:     Conjunctiva/sclera: Conjunctivae normal.  Cardiovascular:     Rate and Rhythm: Normal rate and regular rhythm.     Heart sounds: No murmur heard. Pulmonary:     Effort: Pulmonary effort is normal. No respiratory distress.     Breath sounds: Normal breath sounds.  Abdominal:     Palpations: Abdomen is soft.     Tenderness: There is no abdominal tenderness.  Musculoskeletal:        General: No swelling.     Cervical back: Normal range of motion and neck supple.     Comments: Rhythmic flexion and extension of the right wrist, stops when I ask him to perform tasks   Full range of motion of the upper or lower extremities bilaterally No cervical, thoracic, or lumbar spinal tenderness to palpation  Skin:    General: Skin is warm and dry.     Capillary Refill: Capillary refill takes less than 2 seconds.  Neurological:     General: No focal deficit present.     Mental Status: He is alert.     Comments: 5/5 strength and intact sensation in the upper and lower extremities bilaterally  Psychiatric:        Mood and Affect: Mood normal.     (all labs ordered are listed, but only abnormal results are displayed) Labs Reviewed  CBC WITH DIFFERENTIAL/PLATELET - Abnormal; Notable for the following components:      Result Value   WBC 3.2 (*)    All other components within normal limits  BASIC METABOLIC PANEL  WITH GFR    EKG: EKG Interpretation Date/Time:  Saturday October 16 2023 10:06:16 EDT Ventricular Rate:  89 PR Interval:    QRS Duration:  97 QT Interval:  366 QTC Calculation: 446 R Axis:   45  Text Interpretation: Normal sinus rhythm Nonspecific T wave changes Confirmed by Darra Chew [phone]) on 10/16/2023 10:14:57 AM  Radiology: CT Head Wo Contrast Result Date: 10/16/2023 CLINICAL DATA:  33 year old male status post fall at 0300 hours. Dizziness, upper extremity involuntary shaking.  EXAM: CT HEAD WITHOUT CONTRAST TECHNIQUE: Contiguous axial images were obtained from the base of the skull through the vertex without intravenous contrast. RADIATION DOSE REDUCTION: This exam was performed according to the departmental dose-optimization program which includes automated exposure control, adjustment of the mA and/or kV according to patient size and/or use of iterative reconstruction technique. COMPARISON:  Brain MRI 04/10/2022.  Head CT 07/02/2023. FINDINGS: Brain: Cerebral volume remains normal. No midline shift, ventriculomegaly, mass effect, evidence of mass lesion, intracranial hemorrhage or evidence of cortically based acute infarction. Gray-white matter differentiation is within normal limits throughout the brain. Vascular: No suspicious intracranial vascular hyperdensity. Skull: Stable and intact. Sinuses/Orbits: Visualized paranasal sinuses and mastoids are clear. Other: Visualized orbits and scalp soft tissues are within normal limits. IMPRESSION: No acute traumatic injury identified. Stable and normal noncontrast Head CT. Electronically Signed   By: VEAR Hurst M.D.   On: 10/16/2023 12:13     Procedures   Medications Ordered in the ED - No data to display  Clinical Course as of 10/16/23 1319  Sat Oct 16, 2023  1125 Upon re-evaluation, patient no longer with tremor. Reporting he hit his head getting out of the shower this morning [AF]  1237 Consulted with neurology Dr. Lindzen, he states  patient's symptoms do not seem consistent with seizure activity and he recommends outpatient follow up with his current neurologist. Dr. Merrianne does not recommend any medication changes at this time.  [AF]    Clinical Course User Index [AF] Veta Palma, PA-C                                 Medical Decision Making Amount and/or Complexity of Data Reviewed Labs: ordered. Radiology: ordered.     Differential diagnosis includes but is not limited to Essential tremor, action tremor, electrolyte abnormality, partial seizure  ED Course:  Upon initial evaluation, patient is well-appearing, no acute distress.  Stable vitals aside from his elevated blood pressure 178/100 upon arrival.  Upon my initial exam, he is flexing extending the wrist repetitively, reports this is unintentional.  When I ask him to perform actions, the tremor stops.  He does not have any other neurologic deficits on exam.  Labs Ordered: I Ordered, and personally interpreted labs.  The pertinent results include:   CBC with neutropenia at 3.2, otherwise within normal limits BMP within normal limits  Imaging Studies ordered: I ordered imaging studies including CT head I independently visualized the imaging with scope of interpretation limited to determining acute life threatening conditions related to emergency care. Imaging showed no acute abnormalities I agree with the radiologist interpretation   Cardiac Monitoring: / EKG: The patient was maintained on a cardiac monitor.  I personally viewed and interpreted the cardiac monitored which showed an underlying rhythm of: Normal sinus rhythm   Consultations Obtained: I requested consultation with the neurologist Dr. Lindzen,  and discussed lab and imaging findings as well as pertinent plan -he does not feel this is consistent with seizure activity at this time and patient does not need any further workup in the emergency room.  He recommends outpatient follow-up with  his neurologist.  He does not recommend any medication changes at this time.  Medications Given: None  Upon re-evaluation, patient without any tremor on exam and has remained this way for about an hour.  This occurred without intervention.  His lab work is reassuring without any electrolyte abnormalities to explain the  tremors.  His CT head is without any signs of trauma or explanation to his tremors.  I consulted with neurology, and they do not recommend any further workup in the ER or inpatient.  They recommend outpatient follow-up with his neurologist.  Patient stable and appropriate for discharge home at this time.    Impression: Tremor  Disposition:  The patient was discharged home with instructions to follow-up with his neurologist as soon as possible for further evaluation.  Continue taking his seizure medication, lacosamide , as prescribed.  He does mention that a lot of life stressors, I included office information for the behavioral health center and recommended he follow-up there for any mental health concerns. Return precautions given.    Record Review: External records from outside source obtained and reviewed including neurology note with Dr. Camellia Custard from 07/09/2023 which revealed he does have a history of seizures, currently on vagal nerve stimulator and lacosamide .     This chart was dictated using voice recognition software, Dragon. Despite the best efforts of this provider to proofread and correct errors, errors may still occur which can change documentation meaning.       Final diagnoses:  Tremor    ED Discharge Orders     None          Veta Palma, [place] 10/16/23 1319    Long, Fonda MATSU, MD 10/17/23 [phone]

## 2023-10-16 NOTE — ED Notes (Signed)
 Patient transported to CT

## 2023-10-16 NOTE — Discharge Instructions (Addendum)
 Your workup was reassuring today.  Your blood counts and electrolytes did not show any abnormalities to explain your symptoms.  The CT of your head did not show any acute abnormalities.  I spoke with the neurologist on-call here today.  He did not think this was a seizure and did not recommend any medication changes or other workup here in the ER or in the hospital.  Please contact your neurologist as soon as possible for a follow-up appointment regarding your symptoms.  Your blood pressure was elevated here today at 178/100. Aim to get 30 minutes of exercise daily and limit intake of processed/fast foods. Please take and record your blood pressures at home. Take them at the same time every day. Follow up with your PCP within the next month to discuss if you may need treatment for your blood pressure.   Please return to the ER for any seizure that lasts longer than 5 minutes, chest pain, shortness of breath, any other new or concerning symptoms.

## 2023-10-16 NOTE — ED Triage Notes (Signed)
 Pt arrived via transport. Involuntary hand shaking began approx 0840. Reports falling x 3 this am due to dizziness and hurting. Pain back , arms and shoulders. Episode happened on past Sunday in right arm . Pt took large amount of caffeine , tumeriic, po electrolytes   Hx of seizures reports compliance with meds
